# Patient Record
Sex: Female | Born: 1980 | Race: White | Marital: Married | Smoking: Former smoker
Health system: Southern US, Community
[De-identification: ages and names within clinical notes are randomized; demographics above are authoritative.]

## PROBLEM LIST (undated history)

## (undated) ENCOUNTER — Inpatient Hospital Stay (HOSPITAL_COMMUNITY): Payer: Self-pay

## (undated) DIAGNOSIS — E669 Obesity, unspecified: Secondary | ICD-10-CM

## (undated) DIAGNOSIS — E282 Polycystic ovarian syndrome: Secondary | ICD-10-CM

## (undated) DIAGNOSIS — T7840XA Allergy, unspecified, initial encounter: Secondary | ICD-10-CM

## (undated) DIAGNOSIS — I10 Essential (primary) hypertension: Secondary | ICD-10-CM

## (undated) DIAGNOSIS — Z8619 Personal history of other infectious and parasitic diseases: Secondary | ICD-10-CM

## (undated) HISTORY — DX: Allergy, unspecified, initial encounter: T78.40XA

## (undated) HISTORY — DX: Obesity, unspecified: E66.9

## (undated) HISTORY — PX: LAPAROSCOPIC GASTRIC SLEEVE RESECTION: SHX5895

## (undated) HISTORY — PX: CHOLECYSTECTOMY: SHX55

## (undated) HISTORY — PX: OTHER SURGICAL HISTORY: SHX169

## (undated) HISTORY — DX: Personal history of other infectious and parasitic diseases: Z86.19

## (undated) HISTORY — DX: Polycystic ovarian syndrome: E28.2

---

## 2011-06-16 ENCOUNTER — Other Ambulatory Visit: Payer: Self-pay | Admitting: Internal Medicine

## 2011-06-16 DIAGNOSIS — N926 Irregular menstruation, unspecified: Secondary | ICD-10-CM

## 2011-06-17 ENCOUNTER — Ambulatory Visit
Admission: RE | Admit: 2011-06-17 | Discharge: 2011-06-17 | Disposition: A | Discharge status: Home / Self Care | Payer: Self-pay | Source: Ambulatory Visit | Attending: Internal Medicine | Admitting: Internal Medicine

## 2011-06-17 DIAGNOSIS — N926 Irregular menstruation, unspecified: Secondary | ICD-10-CM

## 2011-10-27 ENCOUNTER — Ambulatory Visit: Payer: Self-pay | Admitting: Family

## 2011-11-12 ENCOUNTER — Encounter: Payer: Self-pay | Admitting: Family

## 2011-11-12 ENCOUNTER — Ambulatory Visit (INDEPENDENT_AMBULATORY_CARE_PROVIDER_SITE_OTHER): Payer: Self-pay | Admitting: Family

## 2011-11-12 VITALS — BP 126/86 | HR 82 | Temp 98.1°F | Resp 16 | Ht 62.5 in | Wt 277.0 lb

## 2011-11-12 DIAGNOSIS — N926 Irregular menstruation, unspecified: Secondary | ICD-10-CM | POA: Insufficient documentation

## 2011-11-12 DIAGNOSIS — T7840XA Allergy, unspecified, initial encounter: Secondary | ICD-10-CM

## 2011-11-12 DIAGNOSIS — E669 Obesity, unspecified: Secondary | ICD-10-CM

## 2011-11-12 DIAGNOSIS — J309 Allergic rhinitis, unspecified: Secondary | ICD-10-CM

## 2011-11-12 DIAGNOSIS — J302 Other seasonal allergic rhinitis: Secondary | ICD-10-CM

## 2011-11-12 HISTORY — DX: Allergy, unspecified, initial encounter: T78.40XA

## 2011-11-12 HISTORY — DX: Obesity, unspecified: E66.9

## 2011-11-12 MED ORDER — NYSTATIN 100000 UNIT/GM EX POWD: CUTANEOUS | Status: DC

## 2011-11-12 NOTE — Patient Instructions (Addendum)
Try to keep calories 1200-1500 calories a day. Exercise 30 minutes 5 days a week. Follow up in 3 months.  Welcome to [facility]!

## 2011-11-12 NOTE — Assessment & Plan Note (Signed)
Previous symptom of cough/congestion are consistent with allergies.  Stable now.  Recommended claritin prn.

## 2011-11-12 NOTE — Assessment & Plan Note (Signed)
?  PCOS- she has had work up.  Will request records as well as recent laboratories.

## 2011-11-12 NOTE — Progress Notes (Signed)
Subjective:    Patient ID: Megan Morton, female    DOB: 05-Oct-1980, 31 y.o.   MRN: [id number]  HPI  Ms.  Avellino is a 31 yr old female here today to establish care.   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  She is from [place].  She has gained 90 pounds since she moved to [place] 5 yrs ago.  She reports that she had lab work 3 months ago.    ?gluten allergy- she notes that bread causes a lot of gas.  She has some eczema.    Irregular Menses- Reports that one of her ovaries had "cyst". Reports normal period 2 weeks ago.  Prior to that she had a period 1 year prior.    Cough- Few months ago had "mucous in the morning.  She sometimes gags with coughing.  She grew up with a smoker.      Review of Systems  Constitutional: Positive for unexpected weight change.  HENT: Negative for hearing loss.   Eyes: Negative for visual disturbance.  Respiratory: Positive for cough.   Cardiovascular: Negative for palpitations.  Gastrointestinal: Negative for nausea and vomiting.  Genitourinary: Positive for menstrual problem.  Musculoskeletal:       Some right shoulder pain. Stands a lot.  Knee pain.  bilaterally  Skin:       Eczema arms She reports rash beneath breast.  Neurological: Negative for headaches.  Psychiatric/Behavioral:       Reports good mood.   Happy since she got married.    Past Medical History  Diagnosis Date  . History of chicken pox     History   Social History  . Marital Status: Married    Spouse Name: N/A    Number of Children: N/A  . Years of Education: N/A   Occupational History  . Not on file.   Social History Main Topics  . Smoking status: Not on file  . Smokeless tobacco: Not on file  . Alcohol Use:   . Drug Use:   . Sexually Active:    Other Topics Concern  . Not on file   Social History Narrative  . No narrative on file    History reviewed. No pertinent past surgical history.  Family History  Problem Relation Age of Onset  .  Hypertension Mother   . Stroke Father   . Hypertension Father     Not on File  No current outpatient prescriptions on file prior to visit.    BP 126/86  Pulse 82  Temp 98.1 F (36.7 C) (Oral)  Resp 16  Ht 5' 2.5" (1.588 m)  Wt 277 lb 0.6 oz (125.665 kg)  BMI 49.86 kg/m2  SpO2 98%  LMP 10/29/2011        Objective:   Physical Exam  Constitutional: She is oriented to person, place, and time. She appears well-developed and well-nourished. No distress.  HENT:  Head: Normocephalic and atraumatic.  Neck: No thyromegaly present.  Cardiovascular: Normal rate and regular rhythm.   No murmur heard. Pulmonary/Chest: Effort normal and breath sounds normal.  Abdominal: Soft. Bowel sounds are normal.  Lymphadenopathy:    She has no cervical adenopathy.  Neurological: She is alert and oriented to person, place, and time.  Skin: Skin is warm and dry.  Psychiatric: She has a normal mood and affect. Her behavior is normal. Judgment and thought content normal.          Assessment & Plan:  ?gluten  allergy- should does not wish to pursue costly testing at this time. Advised trial of gluten free diet.

## 2011-11-12 NOTE — Assessment & Plan Note (Signed)
We discussed 1200 calorie diet and exercise today.

## 2011-11-23 ENCOUNTER — Encounter: Payer: Self-pay | Admitting: Family

## 2011-11-23 DIAGNOSIS — E282 Polycystic ovarian syndrome: Secondary | ICD-10-CM

## 2011-11-23 HISTORY — DX: Polycystic ovarian syndrome: E28.2

## 2011-12-27 ENCOUNTER — Telehealth: Payer: Self-pay | Admitting: Family

## 2011-12-27 NOTE — Telephone Encounter (Signed)
Received medical records from [facility]

## 2012-01-31 ENCOUNTER — Encounter: Payer: Self-pay | Admitting: Family

## 2012-01-31 ENCOUNTER — Ambulatory Visit (INDEPENDENT_AMBULATORY_CARE_PROVIDER_SITE_OTHER): Payer: Self-pay | Admitting: Family

## 2012-01-31 VITALS — BP 110/82 | HR 81 | Temp 98.5°F | Resp 16 | Ht 62.5 in | Wt 280.0 lb

## 2012-01-31 DIAGNOSIS — K625 Hemorrhage of anus and rectum: Secondary | ICD-10-CM

## 2012-01-31 DIAGNOSIS — E282 Polycystic ovarian syndrome: Secondary | ICD-10-CM

## 2012-01-31 DIAGNOSIS — E669 Obesity, unspecified: Secondary | ICD-10-CM

## 2012-01-31 DIAGNOSIS — N926 Irregular menstruation, unspecified: Secondary | ICD-10-CM

## 2012-01-31 DIAGNOSIS — K649 Unspecified hemorrhoids: Secondary | ICD-10-CM

## 2012-01-31 LAB — BASIC METABOLIC PANEL
BUN: 12 mg/dL (ref 6–23)
CO2: 26 mEq/L (ref 19–32)
Chloride: 100 mEq/L (ref 96–112)
Creat: 0.62 mg/dL (ref 0.50–1.10)
Glucose, Bld: 89 mg/dL (ref 70–99)
Potassium: 4.3 mEq/L (ref 3.5–5.3)

## 2012-01-31 LAB — CBC
MCV: 79 fL (ref 78.0–100.0)
Platelets: 313 10*3/uL (ref 150–400)
RDW: 13.8 % (ref 11.5–15.5)
WBC: 8.8 10*3/uL (ref 4.0–10.5)

## 2012-01-31 MED ORDER — METFORMIN HCL 500 MG PO TABS: 500.0000 mg | ORAL_TABLET | Freq: Two times a day (BID) | ORAL | Status: DC

## 2012-01-31 NOTE — Patient Instructions (Addendum)
Go to the ER if you develop increased rectal bleeding.  Complete your blood work prior to leaving.  You may use preparation H daily for the next week.  Keep your stools soft.  You can try miralax one cap full daily with goal of one soft BM a day.  If diarrhea, cut back to every other day.   Follow up in 6 weeks, call if your bleeding does not improve.

## 2012-01-31 NOTE — Progress Notes (Signed)
  Subjective:    Patient ID: Megan Morton, female    DOB: 1980/08/28, 31 y.o.   MRN: [id number]  HPI  [name] is a 31 yr old female who presents today for follow up.  Irregular menses- Had normal period 8/8-8/22. She reports that she was given metformin by GYN for PCOS which she has started.  Obesity-  Reports 10 pound weight gain, but down again.  She has been working out and dieting x 2 weeks.  Sister brought her butram from [place]. She is wondering if this is safe to take for weight loss. Also inquires about Garcinia canbogia   Rectal bleeding- x 10 days.  Denies abdominal pain.  Blood is on the tissue.  Hx of hemorrhoids.  Review of Systems    see HPI  Past Medical History  Diagnosis Date  . History of chicken pox     History   Social History  . Marital Status: Married    Spouse Name: N/A    Number of Children: N/A  . Years of Education: N/A   Occupational History  . Not on file.   Social History Main Topics  . Smoking status: Current Some Day Smoker  . Smokeless tobacco: Not on file  . Alcohol Use: Not on file  . Drug Use: Not on file  . Sexually Active: Not on file   Other Topics Concern  . Not on file   Social History Narrative  . No narrative on file    No past surgical history on file.  Family History  Problem Relation Age of Onset  . Hypertension Mother   . Stroke Father   . Hypertension Father     Not on File  Current Outpatient Prescriptions on File Prior to Visit  Medication Sig Dispense Refill  . nystatin (MYCOSTATIN) powder Apply twice daily beneath breasts as needed for rash.  30 g  1  . metFORMIN (GLUCOPHAGE) 500 MG tablet Take 1 tablet (500 mg total) by mouth 2 (two) times daily with a meal.  60 tablet  0    BP 110/82  Pulse 81  Temp 98.5 F (36.9 C) (Oral)  Resp 16  Ht 5' 2.5" (1.588 m)  Wt 280 lb (127.007 kg)  BMI 50.40 kg/m2  SpO2 99%  LMP 01/13/2012    Objective:   Physical Exam  Constitutional: She is oriented to  person, place, and time. She appears well-developed and well-nourished.  Cardiovascular: Normal rate and regular rhythm.   No murmur heard. Genitourinary:       Some small external hemorrhoids.  Heme neg, normal rectal exam.  Musculoskeletal: She exhibits no edema.  Neurological: She is alert and oriented to person, place, and time.  Skin: Skin is warm and dry. No rash noted. No erythema. No pallor.  Psychiatric: She has a normal mood and affect. Her behavior is normal. Judgment and thought content normal.          Assessment & Plan:

## 2012-02-01 ENCOUNTER — Encounter: Payer: Self-pay | Admitting: Family

## 2012-02-01 NOTE — Assessment & Plan Note (Signed)
I advised her against Butrans which was taken off market in [place] due to safety concerns.  I also advised against Garcinia canbogia as drug interactions unknown as this is not FDA approved.  We discussed continuing her diet efforts and exercise.

## 2012-02-02 DIAGNOSIS — K649 Unspecified hemorrhoids: Secondary | ICD-10-CM | POA: Insufficient documentation

## 2012-02-02 NOTE — Assessment & Plan Note (Addendum)
Recommended that she use miralax to help soften stools.  Also add preparation H and obtain CBC to assess for anemia.  Pt is to call if bleeding worsens or if it does not improve.

## 2012-02-02 NOTE — Assessment & Plan Note (Signed)
Improved with the addition of metfromin.

## 2012-02-02 NOTE — Assessment & Plan Note (Signed)
Continue metformin.

## 2012-03-08 ENCOUNTER — Ambulatory Visit: Payer: Self-pay | Admitting: Internal Medicine

## 2012-05-24 HISTORY — PX: WISDOM TOOTH EXTRACTION: SHX21

## 2012-05-24 LAB — HM PAP SMEAR: HM PAP: NORMAL

## 2013-12-17 ENCOUNTER — Encounter: Payer: Self-pay | Admitting: Family Medicine

## 2013-12-17 ENCOUNTER — Ambulatory Visit (INDEPENDENT_AMBULATORY_CARE_PROVIDER_SITE_OTHER): Payer: BC Managed Care – PPO | Admitting: Family Medicine

## 2013-12-17 VITALS — BP 122/84 | HR 83 | Temp 98.7°F | Ht 62.5 in | Wt 281.0 lb

## 2013-12-17 DIAGNOSIS — M5441 Lumbago with sciatica, right side: Secondary | ICD-10-CM

## 2013-12-17 DIAGNOSIS — M5430 Sciatica, unspecified side: Secondary | ICD-10-CM

## 2013-12-17 DIAGNOSIS — E669 Obesity, unspecified: Secondary | ICD-10-CM

## 2013-12-17 DIAGNOSIS — K649 Unspecified hemorrhoids: Secondary | ICD-10-CM

## 2013-12-17 NOTE — Patient Instructions (Signed)
Preventive Care for Adults A healthy lifestyle and preventive care can promote health and wellness. Preventive health guidelines for women include the following key practices.  A routine yearly physical is a good way to check with your health care provider about your health and preventive screening. It is a chance to share any concerns and updates on your health and to receive a thorough exam.  Visit your dentist for a routine exam and preventive care every 6 months. Brush your teeth twice a day and floss once a day. Good oral hygiene prevents tooth decay and gum disease.  The frequency of eye exams is based on your age, health, family medical history, use of contact lenses, and other factors. Follow your health care provider's recommendations for frequency of eye exams.  Eat a healthy diet. Foods like vegetables, fruits, whole grains, low-fat dairy products, and lean protein foods contain the nutrients you need without too many calories. Decrease your intake of foods high in solid fats, added sugars, and salt. Eat the right amount of calories for you.Get information about a proper diet from your health care provider, if necessary.  Regular physical exercise is one of the most important things you can do for your health. Most adults should get at least 150 minutes of moderate-intensity exercise (any activity that increases your heart rate and causes you to sweat) each week. In addition, most adults need muscle-strengthening exercises on 2 or more days a week.  Maintain a healthy weight. The body mass index (BMI) is a screening tool to identify possible weight problems. It provides an estimate of body fat based on height and weight. Your health care provider can find your BMI and can help you achieve or maintain a healthy weight.For adults 20 years and older:  A BMI below 18.5 is considered underweight.  A BMI of 18.5 to 24.9 is normal.  A BMI of 25 to 29.9 is considered overweight.  A BMI of  30 and above is considered obese.  Maintain normal blood lipids and cholesterol levels by exercising and minimizing your intake of saturated fat. Eat a balanced diet with plenty of fruit and vegetables. Blood tests for lipids and cholesterol should begin at age 76 and be repeated every 5 years. If your lipid or cholesterol levels are high, you are over 50, or you are at high risk for heart disease, you may need your cholesterol levels checked more frequently.Ongoing high lipid and cholesterol levels should be treated with medicines if diet and exercise are not working.  If you smoke, find out from your health care provider how to quit. If you do not use tobacco, do not start.  Lung cancer screening is recommended for adults aged 22-80 years who are at high risk for developing lung cancer because of a history of smoking. A yearly low-dose CT scan of the lungs is recommended for people who have at least a 30-pack-year history of smoking and are a current smoker or have quit within the past 15 years. A pack year of smoking is smoking an average of 1 pack of cigarettes a day for 1 year (for example: 1 pack a day for 30 years or 2 packs a day for 15 years). Yearly screening should continue until the smoker has stopped smoking for at least 15 years. Yearly screening should be stopped for people who develop a health problem that would prevent them from having lung cancer treatment.  If you are pregnant, do not drink alcohol. If you are breastfeeding,  be very cautious about drinking alcohol. If you are not pregnant and choose to drink alcohol, do not have more than 1 drink per day. One drink is considered to be 12 ounces (355 mL) of beer, 5 ounces (148 mL) of wine, or 1.5 ounces (44 mL) of liquor.  Avoid use of street drugs. Do not share needles with anyone. Ask for help if you need support or instructions about stopping the use of drugs.  High blood pressure causes heart disease and increases the risk of  stroke. Your blood pressure should be checked at least every 1 to 2 years. Ongoing high blood pressure should be treated with medicines if weight loss and exercise do not work.  If you are 75-52 years old, ask your health care provider if you should take aspirin to prevent strokes.  Diabetes screening involves taking a blood sample to check your fasting blood sugar level. This should be done once every 3 years, after age 15, if you are within normal weight and without risk factors for diabetes. Testing should be considered at a younger age or be carried out more frequently if you are overweight and have at least 1 risk factor for diabetes.  Breast cancer screening is essential preventive care for women. You should practice "breast self-awareness." This means understanding the normal appearance and feel of your breasts and may include breast self-examination. Any changes detected, no matter how small, should be reported to a health care provider. Women in their 58s and 30s should have a clinical breast exam (CBE) by a health care provider as part of a regular health exam every 1 to 3 years. After age 16, women should have a CBE every year. Starting at age 53, women should consider having a mammogram (breast X-ray test) every year. Women who have a family history of breast cancer should talk to their health care provider about genetic screening. Women at a high risk of breast cancer should talk to their health care providers about having an MRI and a mammogram every year.  Breast cancer gene (BRCA)-related cancer risk assessment is recommended for women who have family members with BRCA-related cancers. BRCA-related cancers include breast, ovarian, tubal, and peritoneal cancers. Having family members with these cancers may be associated with an increased risk for harmful changes (mutations) in the breast cancer genes BRCA1 and BRCA2. Results of the assessment will determine the need for genetic counseling and  BRCA1 and BRCA2 testing.  Routine pelvic exams to screen for cancer are no longer recommended for nonpregnant women who are considered low risk for cancer of the pelvic organs (ovaries, uterus, and vagina) and who do not have symptoms. Ask your health care provider if a screening pelvic exam is right for you.  If you have had past treatment for cervical cancer or a condition that could lead to cancer, you need Pap tests and screening for cancer for at least 20 years after your treatment. If Pap tests have been discontinued, your risk factors (such as having a new sexual partner) need to be reassessed to determine if screening should be resumed. Some women have medical problems that increase the chance of getting cervical cancer. In these cases, your health care provider may recommend more frequent screening and Pap tests.  The HPV test is an additional test that may be used for cervical cancer screening. The HPV test looks for the virus that can cause the cell changes on the cervix. The cells collected during the Pap test can be  tested for HPV. The HPV test could be used to screen women aged 30 years and older, and should be used in women of any age who have unclear Pap test results. After the age of 30, women should have HPV testing at the same frequency as a Pap test.  Colorectal cancer can be detected and often prevented. Most routine colorectal cancer screening begins at the age of 50 years and continues through age 75 years. However, your health care provider may recommend screening at an earlier age if you have risk factors for colon cancer. On a yearly basis, your health care provider may provide home test kits to check for hidden blood in the stool. Use of a small camera at the end of a tube, to directly examine the colon (sigmoidoscopy or colonoscopy), can detect the earliest forms of colorectal cancer. Talk to your health care provider about this at age 50, when routine screening begins. Direct  exam of the colon should be repeated every 5-10 years through age 75 years, unless early forms of pre-cancerous polyps or small growths are found.  People who are at an increased risk for hepatitis B should be screened for this virus. You are considered at high risk for hepatitis B if:  You were born in a country where hepatitis B occurs often. Talk with your health care provider about which countries are considered high risk.  Your parents were born in a high-risk country and you have not received a shot to protect against hepatitis B (hepatitis B vaccine).  You have HIV or AIDS.  You use needles to inject street drugs.  You live with, or have sex with, someone who has hepatitis B.  You get hemodialysis treatment.  You take certain medicines for conditions like cancer, organ transplantation, and autoimmune conditions.  Hepatitis C blood testing is recommended for all people born from 1945 through 1965 and any individual with known risks for hepatitis C.  Practice safe sex. Use condoms and avoid high-risk sexual practices to reduce the spread of sexually transmitted infections (STIs). STIs include gonorrhea, chlamydia, syphilis, trichomonas, herpes, HPV, and human immunodeficiency virus (HIV). Herpes, HIV, and HPV are viral illnesses that have no cure. They can result in disability, cancer, and death.  You should be screened for sexually transmitted illnesses (STIs) including gonorrhea and chlamydia if:  You are sexually active and are younger than 24 years.  You are older than 24 years and your health care provider tells you that you are at risk for this type of infection.  Your sexual activity has changed since you were last screened and you are at an increased risk for chlamydia or gonorrhea. Ask your health care provider if you are at risk.  If you are at risk of being infected with HIV, it is recommended that you take a prescription medicine daily to prevent HIV infection. This is  called preexposure prophylaxis (PrEP). You are considered at risk if:  You are a heterosexual woman, are sexually active, and are at increased risk for HIV infection.  You take drugs by injection.  You are sexually active with a partner who has HIV.  Talk with your health care provider about whether you are at high risk of being infected with HIV. If you choose to begin PrEP, you should first be tested for HIV. You should then be tested every 3 months for as long as you are taking PrEP.  Osteoporosis is a disease in which the bones lose minerals and strength   with aging. This can result in serious bone fractures or breaks. The risk of osteoporosis can be identified using a bone density scan. Women ages 65 years and over and women at risk for fractures or osteoporosis should discuss screening with their health care providers. Ask your health care provider whether you should take a calcium supplement or vitamin D to reduce the rate of osteoporosis.  Menopause can be associated with physical symptoms and risks. Hormone replacement therapy is available to decrease symptoms and risks. You should talk to your health care provider about whether hormone replacement therapy is right for you.  Use sunscreen. Apply sunscreen liberally and repeatedly throughout the day. You should seek shade when your shadow is shorter than you. Protect yourself by wearing long sleeves, pants, a wide-brimmed hat, and sunglasses year round, whenever you are outdoors.  Once a month, do a whole body skin exam, using a mirror to look at the skin on your back. Tell your health care provider of new moles, moles that have irregular borders, moles that are larger than a pencil eraser, or moles that have changed in shape or color.  Stay current with required vaccines (immunizations).  Influenza vaccine. All adults should be immunized every year.  Tetanus, diphtheria, and acellular pertussis (Td, Tdap) vaccine. Pregnant women should  receive 1 dose of Tdap vaccine during each pregnancy. The dose should be obtained regardless of the length of time since the last dose. Immunization is preferred during the 27th-36th week of gestation. An adult who has not previously received Tdap or who does not know her vaccine status should receive 1 dose of Tdap. This initial dose should be followed by tetanus and diphtheria toxoids (Td) booster doses every 10 years. Adults with an unknown or incomplete history of completing a 3-dose immunization series with Td-containing vaccines should begin or complete a primary immunization series including a Tdap dose. Adults should receive a Td booster every 10 years.  Varicella vaccine. An adult without evidence of immunity to varicella should receive 2 doses or a second dose if she has previously received 1 dose. Pregnant females who do not have evidence of immunity should receive the first dose after pregnancy. This first dose should be obtained before leaving the health care facility. The second dose should be obtained 4-8 weeks after the first dose.  Human papillomavirus (HPV) vaccine. Females aged 13-26 years who have not received the vaccine previously should obtain the 3-dose series. The vaccine is not recommended for use in pregnant females. However, pregnancy testing is not needed before receiving a dose. If a female is found to be pregnant after receiving a dose, no treatment is needed. In that case, the remaining doses should be delayed until after the pregnancy. Immunization is recommended for any person with an immunocompromised condition through the age of 26 years if she did not get any or all doses earlier. During the 3-dose series, the second dose should be obtained 4-8 weeks after the first dose. The third dose should be obtained 24 weeks after the first dose and 16 weeks after the second dose.  Zoster vaccine. One dose is recommended for adults aged 60 years or older unless certain conditions are  present.  Measles, mumps, and rubella (MMR) vaccine. Adults born before 1957 generally are considered immune to measles and mumps. Adults born in 1957 or later should have 1 or more doses of MMR vaccine unless there is a contraindication to the vaccine or there is laboratory evidence of immunity to   each of the three diseases. A routine second dose of MMR vaccine should be obtained at least 28 days after the first dose for students attending postsecondary schools, health care workers, or international travelers. People who received inactivated measles vaccine or an unknown type of measles vaccine during 1963-1967 should receive 2 doses of MMR vaccine. People who received inactivated mumps vaccine or an unknown type of mumps vaccine before 1979 and are at high risk for mumps infection should consider immunization with 2 doses of MMR vaccine. For females of childbearing age, rubella immunity should be determined. If there is no evidence of immunity, females who are not pregnant should be vaccinated. If there is no evidence of immunity, females who are pregnant should delay immunization until after pregnancy. Unvaccinated health care workers born before 1957 who lack laboratory evidence of measles, mumps, or rubella immunity or laboratory confirmation of disease should consider measles and mumps immunization with 2 doses of MMR vaccine or rubella immunization with 1 dose of MMR vaccine.  Pneumococcal 13-valent conjugate (PCV13) vaccine. When indicated, a person who is uncertain of her immunization history and has no record of immunization should receive the PCV13 vaccine. An adult aged 19 years or older who has certain medical conditions and has not been previously immunized should receive 1 dose of PCV13 vaccine. This PCV13 should be followed with a dose of pneumococcal polysaccharide (PPSV23) vaccine. The PPSV23 vaccine dose should be obtained at least 8 weeks after the dose of PCV13 vaccine. An adult aged 19  years or older who has certain medical conditions and previously received 1 or more doses of PPSV23 vaccine should receive 1 dose of PCV13. The PCV13 vaccine dose should be obtained 1 or more years after the last PPSV23 vaccine dose.  Pneumococcal polysaccharide (PPSV23) vaccine. When PCV13 is also indicated, PCV13 should be obtained first. All adults aged 65 years and older should be immunized. An adult younger than age 65 years who has certain medical conditions should be immunized. Any person who resides in a nursing home or long-term care facility should be immunized. An adult smoker should be immunized. People with an immunocompromised condition and certain other conditions should receive both PCV13 and PPSV23 vaccines. People with human immunodeficiency virus (HIV) infection should be immunized as soon as possible after diagnosis. Immunization during chemotherapy or radiation therapy should be avoided. Routine use of PPSV23 vaccine is not recommended for American Indians, [place] Natives, or people younger than 65 years unless there are medical conditions that require PPSV23 vaccine. When indicated, people who have unknown immunization and have no record of immunization should receive PPSV23 vaccine. One-time revaccination 5 years after the first dose of PPSV23 is recommended for people aged 19-64 years who have chronic kidney failure, nephrotic syndrome, asplenia, or immunocompromised conditions. People who received 1-2 doses of PPSV23 before age 65 years should receive another dose of PPSV23 vaccine at age 65 years or later if at least 5 years have passed since the previous dose. Doses of PPSV23 are not needed for people immunized with PPSV23 at or after age 65 years.  Meningococcal vaccine. Adults with asplenia or persistent complement component deficiencies should receive 2 doses of quadrivalent meningococcal conjugate (MenACWY-D) vaccine. The doses should be obtained at least 2 months apart.  Microbiologists working with certain meningococcal bacteria, military recruits, people at risk during an outbreak, and people who travel to or live in countries with a high rate of meningitis should be immunized. A first-year college student up through age   21 years who is living in a residence hall should receive a dose if she did not receive a dose on or after her 16th birthday. Adults who have certain high-risk conditions should receive one or more doses of vaccine.  Hepatitis A vaccine. Adults who wish to be protected from this disease, have certain high-risk conditions, work with hepatitis A-infected animals, work in hepatitis A research labs, or travel to or work in countries with a high rate of hepatitis A should be immunized. Adults who were previously unvaccinated and who anticipate close contact with an international adoptee during the first 60 days after arrival in the [place] from a country with a high rate of hepatitis A should be immunized.  Hepatitis B vaccine. Adults who wish to be protected from this disease, have certain high-risk conditions, may be exposed to blood or other infectious body fluids, are household contacts or sex partners of hepatitis B positive people, are clients or workers in certain care facilities, or travel to or work in countries with a high rate of hepatitis B should be immunized.  Haemophilus influenzae type b (Hib) vaccine. A previously unvaccinated person with asplenia or sickle cell disease or having a scheduled splenectomy should receive 1 dose of Hib vaccine. Regardless of previous immunization, a recipient of a hematopoietic stem cell transplant should receive a 3-dose series 6-12 months after her successful transplant. Hib vaccine is not recommended for adults with HIV infection. Preventive Services / Frequency Ages 64 to 68 years  Blood pressure check.** / Every 1 to 2 years.  Lipid and cholesterol check.** / Every 5 years beginning at age  22.  Clinical breast exam.** / Every 3 years for women in their 88s and 53s.  BRCA-related cancer risk assessment.** / For women who have family members with a BRCA-related cancer (breast, ovarian, tubal, or peritoneal cancers).  Pap test.** / Every 2 years from ages 90 through 51. Every 3 years starting at age 21 through age 56 or 3 with a history of 3 consecutive normal Pap tests.  HPV screening.** / Every 3 years from ages 24 through ages 1 to 46 with a history of 3 consecutive normal Pap tests.  Hepatitis C blood test.** / For any individual with known risks for hepatitis C.  Skin self-exam. / Monthly.  Influenza vaccine. / Every year.  Tetanus, diphtheria, and acellular pertussis (Tdap, Td) vaccine.** / Consult your health care provider. Pregnant women should receive 1 dose of Tdap vaccine during each pregnancy. 1 dose of Td every 10 years.  Varicella vaccine.** / Consult your health care provider. Pregnant females who do not have evidence of immunity should receive the first dose after pregnancy.  HPV vaccine. / 3 doses over 6 months, if 72 and younger. The vaccine is not recommended for use in pregnant females. However, pregnancy testing is not needed before receiving a dose.  Measles, mumps, rubella (MMR) vaccine.** / You need at least 1 dose of MMR if you were born in 1957 or later. You may also need a 2nd dose. For females of childbearing age, rubella immunity should be determined. If there is no evidence of immunity, females who are not pregnant should be vaccinated. If there is no evidence of immunity, females who are pregnant should delay immunization until after pregnancy.  Pneumococcal 13-valent conjugate (PCV13) vaccine.** / Consult your health care provider.  Pneumococcal polysaccharide (PPSV23) vaccine.** / 1 to 2 doses if you smoke cigarettes or if you have certain conditions.  Meningococcal vaccine.** /  1 dose if you are age 19 to 21 years and a first-year college  student living in a residence hall, or have one of several medical conditions, you need to get vaccinated against meningococcal disease. You may also need additional booster doses.  Hepatitis A vaccine.** / Consult your health care provider.  Hepatitis B vaccine.** / Consult your health care provider.  Haemophilus influenzae type b (Hib) vaccine.** / Consult your health care provider. Ages 40 to 64 years  Blood pressure check.** / Every 1 to 2 years.  Lipid and cholesterol check.** / Every 5 years beginning at age 20 years.  Lung cancer screening. / Every year if you are aged 55-80 years and have a 30-pack-year history of smoking and currently smoke or have quit within the past 15 years. Yearly screening is stopped once you have quit smoking for at least 15 years or develop a health problem that would prevent you from having lung cancer treatment.  Clinical breast exam.** / Every year after age 40 years.  BRCA-related cancer risk assessment.** / For women who have family members with a BRCA-related cancer (breast, ovarian, tubal, or peritoneal cancers).  Mammogram.** / Every year beginning at age 40 years and continuing for as long as you are in good health. Consult with your health care provider.  Pap test.** / Every 3 years starting at age 30 years through age 65 or 70 years with a history of 3 consecutive normal Pap tests.  HPV screening.** / Every 3 years from ages 30 years through ages 65 to 70 years with a history of 3 consecutive normal Pap tests.  Fecal occult blood test (FOBT) of stool. / Every year beginning at age 50 years and continuing until age 75 years. You may not need to do this test if you get a colonoscopy every 10 years.  Flexible sigmoidoscopy or colonoscopy.** / Every 5 years for a flexible sigmoidoscopy or every 10 years for a colonoscopy beginning at age 50 years and continuing until age 75 years.  Hepatitis C blood test.** / For all people born from 1945 through  1965 and any individual with known risks for hepatitis C.  Skin self-exam. / Monthly.  Influenza vaccine. / Every year.  Tetanus, diphtheria, and acellular pertussis (Tdap/Td) vaccine.** / Consult your health care provider. Pregnant women should receive 1 dose of Tdap vaccine during each pregnancy. 1 dose of Td every 10 years.  Varicella vaccine.** / Consult your health care provider. Pregnant females who do not have evidence of immunity should receive the first dose after pregnancy.  Zoster vaccine.** / 1 dose for adults aged 60 years or older.  Measles, mumps, rubella (MMR) vaccine.** / You need at least 1 dose of MMR if you were born in 1957 or later. You may also need a 2nd dose. For females of childbearing age, rubella immunity should be determined. If there is no evidence of immunity, females who are not pregnant should be vaccinated. If there is no evidence of immunity, females who are pregnant should delay immunization until after pregnancy.  Pneumococcal 13-valent conjugate (PCV13) vaccine.** / Consult your health care provider.  Pneumococcal polysaccharide (PPSV23) vaccine.** / 1 to 2 doses if you smoke cigarettes or if you have certain conditions.  Meningococcal vaccine.** / Consult your health care provider.  Hepatitis A vaccine.** / Consult your health care provider.  Hepatitis B vaccine.** / Consult your health care provider.  Haemophilus influenzae type b (Hib) vaccine.** / Consult your health care provider. Ages 65   years and over  Blood pressure check.** / Every 1 to 2 years.  Lipid and cholesterol check.** / Every 5 years beginning at age 22 years.  Lung cancer screening. / Every year if you are aged 73-80 years and have a 30-pack-year history of smoking and currently smoke or have quit within the past 15 years. Yearly screening is stopped once you have quit smoking for at least 15 years or develop a health problem that would prevent you from having lung cancer  treatment.  Clinical breast exam.** / Every year after age 4 years.  BRCA-related cancer risk assessment.** / For women who have family members with a BRCA-related cancer (breast, ovarian, tubal, or peritoneal cancers).  Mammogram.** / Every year beginning at age 40 years and continuing for as long as you are in good health. Consult with your health care provider.  Pap test.** / Every 3 years starting at age 9 years through age 34 or 91 years with 3 consecutive normal Pap tests. Testing can be stopped between 65 and 70 years with 3 consecutive normal Pap tests and no abnormal Pap or HPV tests in the past 10 years.  HPV screening.** / Every 3 years from ages 57 years through ages 64 or 45 years with a history of 3 consecutive normal Pap tests. Testing can be stopped between 65 and 70 years with 3 consecutive normal Pap tests and no abnormal Pap or HPV tests in the past 10 years.  Fecal occult blood test (FOBT) of stool. / Every year beginning at age 15 years and continuing until age 17 years. You may not need to do this test if you get a colonoscopy every 10 years.  Flexible sigmoidoscopy or colonoscopy.** / Every 5 years for a flexible sigmoidoscopy or every 10 years for a colonoscopy beginning at age 86 years and continuing until age 71 years.  Hepatitis C blood test.** / For all people born from [birth year] through 1963 and any individual with known risks for hepatitis C.  Osteoporosis screening.** / A one-time screening for women ages 83 years and over and women at risk for fractures or osteoporosis.  Skin self-exam. / Monthly.  Influenza vaccine. / Every year.  Tetanus, diphtheria, and acellular pertussis (Tdap/Td) vaccine.** / 1 dose of Td every 10 years.  Varicella vaccine.** / Consult your health care provider.  Zoster vaccine.** / 1 dose for adults aged 61 years or older.  Pneumococcal 13-valent conjugate (PCV13) vaccine.** / Consult your health care provider.  Pneumococcal  polysaccharide (PPSV23) vaccine.** / 1 dose for all adults aged 28 years and older.  Meningococcal vaccine.** / Consult your health care provider.  Hepatitis A vaccine.** / Consult your health care provider.  Hepatitis B vaccine.** / Consult your health care provider.  Haemophilus influenzae type b (Hib) vaccine.** / Consult your health care provider. ** Family history and personal history of risk and conditions may change your health care provider's recommendations. Document Released: 07/06/2001 Document Revised: 09/24/2013 Document Reviewed: 10/05/2010 [facility] Patient Information 2015 [place], [place]. This information is not intended to replace advice given to you by your health care provider. Make sure you discuss any questions you have with your health care provider.

## 2013-12-17 NOTE — Progress Notes (Signed)
Pre visit review using our clinic review tool, if applicable. No additional management support is needed unless otherwise documented below in the visit note. 

## 2013-12-23 ENCOUNTER — Encounter: Payer: Self-pay | Admitting: Family Medicine

## 2013-12-23 DIAGNOSIS — M545 Low back pain: Secondary | ICD-10-CM | POA: Insufficient documentation

## 2013-12-23 DIAGNOSIS — M79604 Pain in right leg: Secondary | ICD-10-CM | POA: Insufficient documentation

## 2013-12-23 NOTE — Assessment & Plan Note (Signed)
Encouraged DASH diet, decrease po intake and increase exercise as tolerated. Needs 7-8 hours of sleep nightly. Avoid trans fats, eat small, frequent meals every 4-5 hours with lean proteins, complex carbs and healthy fats. Minimize simple carbs 

## 2013-12-23 NOTE — Assessment & Plan Note (Addendum)
No new or recent episodes. Encouraged increased hydration and fiber in diet. Daily probiotics. If bowels not moving can use MOM 2 tbls po in 4 oz of warm prune juice by mouth every 2-3 days. If no results then repeat in 4 hours with  Dulcolax suppository pr, may repeat again in 4 more hours as needed. Seek care if symptoms worsen.

## 2013-12-23 NOTE — Progress Notes (Signed)
Patient ID: Megan AngelMary Morton, female   DOB: 06/16/1980, 33 y.o.   MRN: [id number] Megan AngelMary Morton [id number] 05/24/1980 12/23/2013      Progress Note-Follow Up  Subjective  Chief Complaint  Chief Complaint  Patient presents with  . Establish Care    new patient    HPI  Patient is a 33 year old female in today to establish care. She is feeling fairly well but is frustrated with her ongoing weight concerns. No recent illness. Has been following with orthopaedics for her low back pain and some radicular symptoms into her rigttleg including some numbness into right foot. Has been advised she has issues with bulging discs but notes and images not available at time of visit. No falls or trauma, no incontinence. Denies CP/palp/SOB/HA/congestion/fevers/GI or GU c/o. Taking meds as prescribed  Past Medical History  Diagnosis Date  . History of chicken pox   . Polycystic ovarian disease 11/23/2011  . 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 Obesity 11/12/2011    Past Surgical History  Procedure Laterality Date  . Wisdom tooth extraction  2014    Family History  Problem Relation Age of Onset  . Hypertension Mother   . Stroke Father   . Hypertension Father     History   Social History  . Marital Status: Married    Spouse Name: N/A    Number of Children: N/A  . Years of Education: N/A   Occupational History  . Not on file.   Social History Main Topics  . Smoking status: Current Some Day Smoker  . Smokeless tobacco: Never Used     Comment: hookah  . Alcohol Use: No  . Drug Use: No  . Sexual Activity: Yes    Partners: Male   Other Topics Concern  . Not on file   Social History Narrative  . No narrative on file    No current outpatient prescriptions on file prior to visit.   No current facility-administered medications on file prior to visit.    No Known Allergies  Review of Systems  Review of Systems  Constitutional: Positive for malaise/fatigue. Negative for fever.  HENT: Negative for  congestion.   Eyes: Negative for discharge.  Respiratory: Negative for shortness of breath.   Cardiovascular: Negative for chest pain, palpitations and leg swelling.  Gastrointestinal: Negative for nausea, abdominal pain and diarrhea.  Genitourinary: Negative for dysuria.  Musculoskeletal: Negative for falls.  Skin: Negative for rash.  Neurological: Negative for loss of consciousness and headaches.  Endo/Heme/Allergies: Negative for polydipsia.  Psychiatric/Behavioral: Negative for depression and suicidal ideas. The patient is not nervous/anxious and does not have insomnia.     Objective  BP 122/84  Pulse 83  Temp(Src) 98.7 F (37.1 C) (Oral)  Ht 5' 2.5" (1.588 m)  Wt 281 lb 0.6 oz (127.479 kg)  BMI 50.55 kg/m2  SpO2 97%  LMP 11/29/2013  Physical Exam  Physical Exam  Constitutional: She is oriented to person, place, and time and well-developed, well-nourished, and in no distress. No distress.  HENT:  Head: Normocephalic and atraumatic.  Right Ear: External ear normal.  Left Ear: External ear normal.  Nose: Nose normal.  Mouth/Throat: Oropharynx is clear and moist. No oropharyngeal exudate.  Eyes: Conjunctivae are normal. Pupils are equal, round, and reactive to light. Right eye exhibits no discharge. Left eye exhibits no discharge. No scleral icterus.  Neck: Normal range of motion. Neck supple. No thyromegaly present.  Cardiovascular: Normal rate, regular rhythm, normal heart sounds and intact distal pulses.  No murmur heard. Pulmonary/Chest: Effort normal and breath sounds normal. No respiratory distress. She has no wheezes. She has no rales.  Abdominal: Soft. Bowel sounds are normal. She exhibits no distension and no mass. There is no tenderness.  Musculoskeletal: Normal range of motion. She exhibits no edema and no tenderness.  Lymphadenopathy:    She has no cervical adenopathy.  Neurological: She is alert and oriented to person, place, and time. She has normal  reflexes. No cranial nerve deficit. Coordination normal.  Skin: Skin is warm and dry. No rash noted. She is not diaphoretic.  Psychiatric: Mood, memory and affect normal.    No results found for this basename: TSH   Lab Results  Component Value Date   WBC 8.8 01/31/2012   HGB 13.3 01/31/2012   HCT 39.8 01/31/2012   MCV 79.0 01/31/2012   PLT 313 01/31/2012   Lab Results  Component Value Date   CREATININE 0.62 01/31/2012   BUN 12 01/31/2012   NA 136 01/31/2012   K 4.3 01/31/2012   CL 100 01/31/2012   CO2 26 01/31/2012     Assessment & Plan  Low back pain radiating to right leg Encouraged activity as tolerated and topical treatments such as Salon Pas prn. Attempt modest weight loss. Referred for PT  Obesity Encouraged DASH diet, decrease po intake and increase exercise as tolerated. Needs 7-8 hours of sleep nightly. Avoid trans fats, eat small, frequent meals every 4-5 hours with lean proteins, complex carbs and healthy fats. Minimize simple carbs  Bleeding hemorrhoids No new or recent episodes. Encouraged increased hydration and fiber in diet. Daily probiotics. If bowels not moving can use MOM 2 tbls po in 4 oz of warm prune juice by mouth every 2-3 days. If no results then repeat in 4 hours with  Dulcolax suppository pr, may repeat again in 4 more hours as needed. Seek care if symptoms worsen.

## 2013-12-23 NOTE — Assessment & Plan Note (Addendum)
Encouraged activity as tolerated and topical treatments such as Salon Pas prn. Attempt modest weight loss. Referred for PT

## 2013-12-28 ENCOUNTER — Ambulatory Visit: Payer: BC Managed Care – PPO | Admitting: Physical Therapy

## 2014-01-03 ENCOUNTER — Ambulatory Visit: Payer: BC Managed Care – PPO | Admitting: Physical Therapy

## 2014-02-06 ENCOUNTER — Encounter: Payer: BC Managed Care – PPO | Admitting: Family

## 2014-02-06 ENCOUNTER — Telehealth: Payer: Self-pay | Admitting: Family

## 2014-02-06 DIAGNOSIS — Z0289 Encounter for other administrative examinations: Secondary | ICD-10-CM

## 2014-02-06 NOTE — Telephone Encounter (Signed)
Please send no show letter

## 2014-02-06 NOTE — Telephone Encounter (Signed)
Pt no showed 7:15 apt on 9/19 for CPE.

## 2014-02-06 NOTE — Telephone Encounter (Signed)
Letter mailed

## 2014-03-21 ENCOUNTER — Encounter: Payer: Self-pay | Admitting: Family Medicine

## 2014-03-21 ENCOUNTER — Ambulatory Visit (INDEPENDENT_AMBULATORY_CARE_PROVIDER_SITE_OTHER): Payer: BC Managed Care – PPO | Admitting: Family Medicine

## 2014-03-21 VITALS — BP 139/76 | HR 98 | Temp 98.5°F | Ht 62.5 in | Wt 288.2 lb

## 2014-03-21 DIAGNOSIS — M545 Low back pain, unspecified: Secondary | ICD-10-CM

## 2014-03-21 DIAGNOSIS — T7840XD Allergy, unspecified, subsequent encounter: Secondary | ICD-10-CM

## 2014-03-21 DIAGNOSIS — E669 Obesity, unspecified: Secondary | ICD-10-CM

## 2014-03-21 DIAGNOSIS — K649 Unspecified hemorrhoids: Secondary | ICD-10-CM

## 2014-03-21 DIAGNOSIS — M79604 Pain in right leg: Secondary | ICD-10-CM

## 2014-03-21 NOTE — Progress Notes (Signed)
Pre visit review using our clinic review tool, if applicable. No additional management support is needed unless otherwise documented below in the visit note. 

## 2014-03-21 NOTE — Patient Instructions (Signed)
Clean bottom with witch hazel astringent each time, if still have itching or irritation then apply Preparation H  Cream is still having trouble let us know.   Salon Pas Gel or patches for pain  Monthly breast Exam  PMD to Dr Abner GreenspanBlyth   [facility] DASH stands for "Dietary Approaches to Stop Hypertension." The DASH eating plan is a healthy eating plan that has been shown to reduce high blood pressure (hypertension). Additional health benefits may include reducing the risk of type 2 diabetes mellitus, heart disease, and stroke. The DASH eating plan may also help with weight loss. WHAT DO I NEED TO KNOW ABOUT THE DASH EATING PLAN? For the DASH eating plan, you will follow these general guidelines:  Choose foods with a percent daily value for sodium of less than 5% (as listed on the food label).  Use salt-free seasonings or herbs instead of table salt or sea salt.  Check with your health care provider or pharmacist before using salt substitutes.  Eat lower-sodium products, often labeled as "lower sodium" or "no salt added."  Eat fresh foods.  Eat more vegetables, fruits, and low-fat dairy products.  Choose whole grains. Look for the word "whole" as the first word in the ingredient list.  Choose fish and skinless chicken or [place] more often than red meat. Limit fish, poultry, and meat to 6 oz (170 g) each day.  Limit sweets, desserts, sugars, and sugary drinks.  Choose heart-healthy fats.  Limit cheese to 1 oz (28 g) per day.  Eat more home-cooked food and less restaurant, buffet, and fast food.  Limit fried foods.  Cook foods using methods other than frying.  Limit canned vegetables. If you do use them, rinse them well to decrease the sodium.  When eating at a restaurant, ask that your food be prepared with less salt, or no salt if possible. WHAT FOODS CAN I EAT? Seek help from a dietitian for individual calorie needs. Grains Whole grain or whole wheat bread. Brown rice.  Whole grain or whole wheat pasta. Quinoa, bulgur, and whole grain cereals. Low-sodium cereals. Corn or whole wheat flour tortillas. Whole grain cornbread. Whole grain crackers. Low-sodium crackers. Vegetables Fresh or frozen vegetables (raw, steamed, roasted, or grilled). Low-sodium or reduced-sodium tomato and vegetable juices. Low-sodium or reduced-sodium tomato sauce and paste. Low-sodium or reduced-sodium canned vegetables.  Fruits All fresh, canned (in natural juice), or frozen fruits. Meat and Other Protein Products Ground beef (85% or leaner), grass-fed beef, or beef trimmed of fat. Skinless chicken or [place]key. Ground chicken or [place]key. Pork trimmed of fat. All fish and seafood. Eggs. Dried beans, peas, or lentils. Unsalted nuts and seeds. Unsalted canned beans. Dairy Low-fat dairy products, such as skim or 1% milk, 2% or reduced-fat cheeses, low-fat ricotta or cottage cheese, or plain low-fat yogurt. Low-sodium or reduced-sodium cheeses. Fats and Oils Tub margarines without trans fats. Light or reduced-fat mayonnaise and salad dressings (reduced sodium). Avocado. Safflower, olive, or canola oils. Natural peanut or almond butter. Other Unsalted popcorn and pretzels. The items listed above may not be a complete list of recommended foods or beverages. Contact your dietitian for more options. WHAT FOODS ARE NOT RECOMMENDED? Grains White bread. White pasta. White rice. Refined cornbread. Bagels and croissants. Crackers that contain trans fat. Vegetables Creamed or fried vegetables. Vegetables in a cheese sauce. Regular canned vegetables. Regular canned tomato sauce and paste. Regular tomato and vegetable juices. Fruits Dried fruits. Canned fruit in light or heavy syrup. Fruit juice. Meat  and Other Protein Products Fatty cuts of meat. Ribs, chicken wings, bacon, sausage, bologna, salami, chitterlings, fatback, hot dogs, bratwurst, and packaged luncheon meats. Salted nuts and seeds. Canned  beans with salt. Dairy Whole or 2% milk, cream, half-and-half, and cream cheese. Whole-fat or sweetened yogurt. Full-fat cheeses or blue cheese. Nondairy creamers and whipped toppings. Processed cheese, cheese spreads, or cheese curds. Condiments Onion and garlic salt, seasoned salt, table salt, and sea salt. Canned and packaged gravies. Worcestershire sauce. Tartar sauce. Barbecue sauce. Teriyaki sauce. Soy sauce, including reduced sodium. Steak sauce. Fish sauce. Oyster sauce. Cocktail sauce. Horseradish. Ketchup and mustard. Meat flavorings and tenderizers. Bouillon cubes. Hot sauce. Tabasco sauce. Marinades. Taco seasonings. Relishes. Fats and Oils Butter, stick margarine, lard, shortening, ghee, and bacon fat. Coconut, palm kernel, or palm oils. Regular salad dressings. Other Pickles and olives. Salted popcorn and pretzels. The items listed above may not be a complete list of foods and beverages to avoid. Contact your dietitian for more information. WHERE CAN I FIND MORE INFORMATION? National Heart, Lung, and Blood Institute: CableProm[employer].itwww.nhlbi.nih.gov/health/health-topics/topics/dash/ Document Released: 04/29/2011 Document Revised: 09/24/2013 Document Reviewed: 03/14/2013 [facility] Patient Information 2015 [place]. This information is not intended to replace advice given to you by your health care provider. Make sure you discuss any questions you have with your health care provider.

## 2014-03-22 ENCOUNTER — Telehealth: Payer: Self-pay | Admitting: Family

## 2014-03-22 NOTE — Telephone Encounter (Signed)
emmi mailed  °

## 2014-03-31 ENCOUNTER — Encounter: Payer: Self-pay | Admitting: Family Medicine

## 2014-03-31 NOTE — Progress Notes (Signed)
Patient ID: Megan Morton, female   DOB: 04/15/1981, 33 y.o.   MRN: [id number] Megan AngelMary Magana [id number] 07/25/1980 03/31/2014      Progress Note-Follow Up  Subjective  Chief Complaint  Chief Complaint  Patient presents with  . Allergies    HPI  Patient is a 33 year old female in today for routine medical care. She is here to discuss change in PCP and medical concerns. Has continued to struggle with low back pain, it improves and worsens intermittently. No incontinence. Denies CP/palp/SOB/HA/congestion/fevers/GI or GU c/o. Taking meds as prescribed  Past Medical History  Diagnosis Date  . History of chicken pox   . Polycystic ovarian disease 11/23/2011  . 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 Obesity 11/12/2011    Past Surgical History  Procedure Laterality Date  . Wisdom tooth extraction  2014    Family History  Problem Relation Age of Onset  . Hypertension Mother   . Stroke Father   . Hypertension Father     History   Social History  . Marital Status: Married    Spouse Name: N/A    Number of Children: N/A  . Years of Education: N/A   Occupational History  . Not on file.   Social History Main Topics  . Smoking status: Current Some Day Smoker  . Smokeless tobacco: Never Used     Comment: hookah  . Alcohol Use: No  . Drug Use: No  . Sexual Activity:    Partners: Male   Other Topics Concern  . Not on file   Social History Narrative    No current outpatient prescriptions on file prior to visit.   No current facility-administered medications on file prior to visit.    No Known Allergies  Review of Systems  Review of Systems  Constitutional: Negative for fever and malaise/fatigue.  HENT: Positive for congestion.   Eyes: Negative for discharge.  Respiratory: Negative for shortness of breath.   Cardiovascular: Negative for chest pain, palpitations and leg swelling.  Gastrointestinal: Negative for nausea, abdominal pain and diarrhea.  Genitourinary: Negative for  dysuria.  Musculoskeletal: Positive for back pain. Negative for falls.  Skin: Negative for rash.  Neurological: Negative for loss of consciousness and headaches.  Endo/Heme/Allergies: Negative for polydipsia.  Psychiatric/Behavioral: Negative for depression and suicidal ideas. The patient is nervous/anxious. The patient does not have insomnia.     Objective  BP 139/76 mmHg  Pulse 98  Temp(Src) 98.5 F (36.9 C) (Oral)  Ht 5' 2.5" (1.588 m)  Wt 288 lb 3.2 oz (130.727 kg)  BMI 51.84 kg/m2  SpO2 98%  LMP 02/25/2014  Physical Exam  Physical Exam  Constitutional: She is oriented to person, place, and time and well-developed, well-nourished, and in no distress. No distress.  HENT:  Head: Normocephalic and atraumatic.  Eyes: Conjunctivae are normal.  Neck: Neck supple. No thyromegaly present.  Cardiovascular: Normal rate, regular rhythm and normal heart sounds.   No murmur heard. Pulmonary/Chest: Effort normal and breath sounds normal. She has no wheezes.  Abdominal: She exhibits no distension and no mass.  Musculoskeletal: She exhibits no edema.  Lymphadenopathy:    She has no cervical adenopathy.  Neurological: She is alert and oriented to person, place, and time.  Skin: Skin is warm and dry. No rash noted. She is not diaphoretic.  Psychiatric: Memory, affect and judgment normal.    No results found for: TSH Lab Results  Component Value Date   WBC 8.8 01/31/2012   HGB 13.3 01/31/2012  HCT 39.8 01/31/2012   MCV 79.0 01/31/2012   PLT 313 01/31/2012   Lab Results  Component Value Date   CREATININE 0.62 01/31/2012   BUN 12 01/31/2012   NA 136 01/31/2012   K 4.3 01/31/2012   CL 100 01/31/2012   CO2 26 01/31/2012   No results found for: ALT, AST, GGT, ALKPHOS, BILITOT No results found for: CHOL No results found for: HDL No results found for: LDLCALC No results found for: TRIG No results found for: CHOLHDL   Assessment & Plan  Allergic state No recent flares,  may user OTC meds prn  Low back pain radiating to right leg Encouraged moist heat and gentle stretching as tolerated. May try NSAIDs and prescription meds as directed and report if symptoms worsen or seek immediate care. Consider PT  Obesity Encouraged DASH diet, decrease po intake and increase exercise as tolerated. Needs 7-8 hours of sleep nightly. Avoid trans fats, eat small, frequent meals every 4-5 hours with lean proteins, complex carbs and healthy fats. Minimize simple carbs, GMO foods.  Bleeding hemorrhoids Not symptomatic at present. Encouraged increased hydration and fiber in diet. Daily probiotics. If bowels not moving can use MOM 2 tbls po in 4 oz of warm prune juice by mouth every 2-3 days. If no results then repeat in 4 hours with  Dulcolax suppository pr, may repeat again in 4 more hours as needed. Seek care if symptoms worsen. Consider daily Miralax and/or Dulcolax if symptoms persist.

## 2014-03-31 NOTE — Assessment & Plan Note (Signed)
Not symptomatic at present. Encouraged increased hydration and fiber in diet. Daily probiotics. If bowels not moving can use MOM 2 tbls po in 4 oz of warm prune juice by mouth every 2-3 days. If no results then repeat in 4 hours with  Dulcolax suppository pr, may repeat again in 4 more hours as needed. Seek care if symptoms worsen. Consider daily Miralax and/or Dulcolax if symptoms persist.

## 2014-03-31 NOTE — Assessment & Plan Note (Signed)
Encouraged DASH diet, decrease po intake and increase exercise as tolerated. Needs 7-8 hours of sleep nightly. Avoid trans fats, eat small, frequent meals every 4-5 hours with lean proteins, complex carbs and healthy fats. Minimize simple carbs, GMO foods. 

## 2014-03-31 NOTE — Assessment & Plan Note (Signed)
No recent flares, may user OTC meds prn

## 2014-03-31 NOTE — Assessment & Plan Note (Signed)
Encouraged moist heat and gentle stretching as tolerated. May try NSAIDs and prescription meds as directed and report if symptoms worsen or seek immediate care. Consider PT

## 2014-06-13 ENCOUNTER — Ambulatory Visit: Payer: Self-pay | Admitting: Podiatrist

## 2014-06-24 ENCOUNTER — Ambulatory Visit (INDEPENDENT_AMBULATORY_CARE_PROVIDER_SITE_OTHER): Payer: 59 | Admitting: Family

## 2014-06-24 ENCOUNTER — Encounter: Payer: Self-pay | Admitting: Family

## 2014-06-24 ENCOUNTER — Other Ambulatory Visit (HOSPITAL_COMMUNITY)
Admission: RE | Admit: 2014-06-24 | Discharge: 2014-06-24 | Disposition: A | Discharge status: Home / Self Care | Payer: 59 | Source: Ambulatory Visit | Attending: Family | Admitting: Family

## 2014-06-24 VITALS — BP 122/78 | HR 78 | Temp 98.4°F | Resp 16 | Wt 268.0 lb

## 2014-06-24 DIAGNOSIS — Z01419 Encounter for gynecological examination (general) (routine) without abnormal findings: Secondary | ICD-10-CM

## 2014-06-24 DIAGNOSIS — Z0000 Encounter for general adult medical examination without abnormal findings: Secondary | ICD-10-CM | POA: Insufficient documentation

## 2014-06-24 DIAGNOSIS — Z1151 Encounter for screening for human papillomavirus (HPV): Secondary | ICD-10-CM | POA: Diagnosis present

## 2014-06-24 DIAGNOSIS — L600 Ingrowing nail: Secondary | ICD-10-CM

## 2014-06-24 LAB — BASIC METABOLIC PANEL
BUN: 13 mg/dL (ref 6–23)
CHLORIDE: 102 meq/L (ref 96–112)
CO2: 26 mEq/L (ref 19–32)
Calcium: 9.3 mg/dL (ref 8.4–10.5)
Creatinine, Ser: 0.6 mg/dL (ref 0.40–1.20)
GFR: 123.26 mL/min (ref >60.00)
Glucose, Bld: 74 mg/dL (ref 70–99)
Potassium: 4.2 mEq/L (ref 3.5–5.1)
Sodium: 136 mEq/L (ref 135–145)

## 2014-06-24 LAB — LIPID PANEL
Cholesterol: 184 mg/dL (ref 0–200)
HDL: 44.7 mg/dL (ref >39.00)
LDL Cholesterol: 119 mg/dL — ABNORMAL HIGH (ref 0–99)
NONHDL: 139.3
TRIGLYCERIDES: 104 mg/dL (ref 0.0–149.0)
Total CHOL/HDL Ratio: 4
VLDL: 20.8 mg/dL (ref 0.0–40.0)

## 2014-06-24 LAB — URINALYSIS, ROUTINE W REFLEX MICROSCOPIC
Bilirubin Urine: NEGATIVE
Hgb urine dipstick: NEGATIVE
KETONES UR: NEGATIVE
LEUKOCYTES UA: NEGATIVE
NITRITE: NEGATIVE
PH: 6 (ref 5.0–8.0)
RBC / HPF: NONE SEEN (ref 0–?)
SPECIFIC GRAVITY, URINE: 1.025 (ref 1.000–1.030)
TOTAL PROTEIN, URINE-UPE24: NEGATIVE
Urine Glucose: NEGATIVE
Urobilinogen, UA: 0.2 (ref 0.0–1.0)
WBC, UA: NONE SEEN (ref 0–?)

## 2014-06-24 LAB — CBC WITH DIFFERENTIAL/PLATELET
BASOS PCT: 0.3 % (ref 0.0–3.0)
Basophils Absolute: 0 10*3/uL (ref 0.0–0.1)
Eosinophils Absolute: 0.1 10*3/uL (ref 0.0–0.7)
Eosinophils Relative: 1.2 % (ref 0.0–5.0)
HCT: 40.4 % (ref 36.0–46.0)
Hemoglobin: 13.5 g/dL (ref 12.0–15.0)
Lymphocytes Relative: 24.7 % (ref 12.0–46.0)
Lymphs Abs: 2.7 10*3/uL (ref 0.7–4.0)
MCHC: 33.4 g/dL (ref 30.0–36.0)
MCV: 79.5 fl (ref 78.0–100.0)
Monocytes Absolute: 0.5 10*3/uL (ref 0.1–1.0)
Monocytes Relative: 4.3 % (ref 3.0–12.0)
Neutro Abs: 7.5 10*3/uL (ref 1.4–7.7)
Neutrophils Relative %: 69.5 % (ref 43.0–77.0)
Platelets: 316 10*3/uL (ref 150.0–400.0)
RBC: 5.08 Mil/uL (ref 3.87–5.11)
RDW: 13.6 % (ref 11.5–15.5)
WBC: 10.8 10*3/uL — AB (ref 4.0–10.5)

## 2014-06-24 LAB — HEPATIC FUNCTION PANEL
ALBUMIN: 4.3 g/dL (ref 3.5–5.2)
ALT: 20 U/L (ref 0–35)
AST: 17 U/L (ref 0–37)
Alkaline Phosphatase: 72 U/L (ref 39–117)
Bilirubin, Direct: 0.1 mg/dL (ref 0.0–0.3)
TOTAL PROTEIN: 8 g/dL (ref 6.0–8.3)
Total Bilirubin: 0.4 mg/dL (ref 0.2–1.2)

## 2014-06-24 LAB — TSH: TSH: 2.25 u[IU]/mL (ref 0.35–4.50)

## 2014-06-24 MED ORDER — CEPHALEXIN 500 MG PO CAPS: 500.0000 mg | ORAL_CAPSULE | Freq: Two times a day (BID) | ORAL | Status: DC

## 2014-06-24 NOTE — Progress Notes (Signed)
Subjective:    Patient ID: Megan AngelMary Morton, female    DOB: 04/13/1981, 34 y.o.   MRN: [id number]  HPI  Patient presents today for complete physical.  Immunizations: declines flu shot Diet: reports that she is using AB slim from [place]  Exercise: she swims 2-3 times a week Pap Smear: 2 + years ago, wants to do today.  She is unsure if she has ever had an abnormal pap    Review of Systems  Constitutional: Negative for unexpected weight change.  HENT: Negative for rhinorrhea.   Respiratory: Negative for cough.   Gastrointestinal: Negative for nausea, vomiting and diarrhea.  Genitourinary: Negative for dysuria.       + painful periods  Musculoskeletal:       Reports chronic right shoulder pain, attributes to work, but has improved since she has lost weight  Neurological: Negative for headaches.  Hematological: Negative for adenopathy.  Psychiatric/Behavioral:       Denies depression/anxiety   Past Medical History  Diagnosis Date  . History of chicken pox   . Polycystic ovarian disease 11/23/2011  . 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 Obesity 11/12/2011    History   Social History  . Marital Status: Married    Spouse Name: N/A    Number of Children: N/A  . Years of Education: N/A   Occupational History  . Not on file.   Social History Main Topics  . Smoking status: Current Some Day Smoker  . Smokeless tobacco: Never Used     Comment: hookah  . Alcohol Use: No  . Drug Use: No  . Sexual Activity:    Partners: Male   Other Topics Concern  . 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    Past Surgical History  Procedure Laterality Date  . Wisdom tooth extraction  2014    Family History  Problem Relation Age of Onset  . Hypertension Mother   . Stroke Father   . Hypertension Father     No Known Allergies  No current outpatient prescriptions on file prior  to visit.   No current facility-administered medications on file prior to visit.    BP 122/78 mmHg  Pulse 78  Temp(Src) 98.4 F (36.9 C) (Oral)  Resp 16  Wt 268 lb (121.564 kg)  LMP 05/28/2014      Objective:   Physical Exam  Physical Exam  Constitutional: She is oriented to person, place, and time. She appears well-developed and well-nourished. No distress.  HENT:  Head: Normocephalic and atraumatic.  Right Ear: Tympanic membrane and ear canal normal.  Left Ear: Tympanic membrane and ear canal normal.  Mouth/Throat: Oropharynx is clear and moist.  Eyes: Pupils are equal, round, and reactive to light. No scleral icterus.  Neck: Normal range of motion. No thyromegaly present.  Cardiovascular: Normal rate and regular rhythm.   No murmur heard. Pulmonary/Chest: Effort normal and breath sounds normal. No respiratory distress. He has no wheezes. She has no rales. She exhibits no tenderness.  Abdominal: Soft. Bowel sounds are normal. He exhibits no distension and no mass. There is no tenderness. There is no rebound and no guarding.  Musculoskeletal: She exhibits no edema.  Lymphadenopathy:    She has no cervical adenopathy.  Neurological: She is alert and oriented to person, place, and time.  She exhibits normal  muscle tone. Coordination normal.  Skin: Skin is warm and dry. right great toe with some erythema noted surrounding left nailbed Psychiatric: She has a normal mood and affect. Her behavior is normal. Judgment and thought content normal.  Breasts: Examined lying Right: Without masses, retractions, discharge or axillary adenopathy.  Left: Without masses, retractions, discharge or axillary adenopathy.  Inguinal/mons: Normal without inguinal adenopathy  External genitalia: Normal  BUS/Urethra/Skene's glands: Normal  Bladder: Normal  Vagina: Normal  Cervix: Normal  Uterus: normal in size, shape and contour. Midline and mobile  Adnexa/parametria:  Rt: Without masses or  tenderness.  Lt: Without masses or tenderness.  Anus and perineum: Normal           Assessment & Plan:         Assessment & Plan:

## 2014-06-24 NOTE — Patient Instructions (Signed)
Please complete your lab work prior to leaving. Continue your work with diet/exercise weight loss. Follow up in 1 year, sooner if problems/concerns.

## 2014-06-24 NOTE — Assessment & Plan Note (Signed)
Start keflex, soak feet bid.  Refer to podiatry.

## 2014-06-24 NOTE — Addendum Note (Signed)
Addended by: Mervin KungFERGERSON, Shalonda Sachse A on: 06/24/2014 10:53 AM   Modules accepted: Orders

## 2014-06-24 NOTE — Assessment & Plan Note (Signed)
Continue exercise, weight loss. Pap, lab work today.

## 2014-06-26 ENCOUNTER — Encounter: Payer: Self-pay | Admitting: Family

## 2014-06-26 LAB — CYTOLOGY - PAP

## 2014-06-29 ENCOUNTER — Telehealth: Payer: Self-pay | Admitting: Family

## 2014-06-29 NOTE — Telephone Encounter (Signed)
Please remind pt to schedule a lab visit.  Needs labs drawn. She has upcoming apt for 30 min with Dr. Abner GreenspanBlyth that was made last fall.  Not sure she needs that visit since she just had cpx.  It is up to her.

## 2014-07-01 NOTE — Telephone Encounter (Signed)
Notified pt and she voices understanding. Pt states she will have to ask her husband about her upcoming appt with Dr Abner GreenspanBlyth and will call us back.

## 2014-07-01 NOTE — Telephone Encounter (Signed)
Spoke with pt she states labs were completed on 06/24/14. Results are in EPIC.  Is there something else we need to draw?

## 2014-07-01 NOTE — Telephone Encounter (Signed)
Pt called back and states she would like to cancel upcoming appt in 08/2014 and she is going to find a new doctor elsewhere.

## 2014-07-01 NOTE — Telephone Encounter (Signed)
Noted & all set

## 2014-07-12 ENCOUNTER — Encounter: Payer: Self-pay | Admitting: Podiatry

## 2014-07-12 ENCOUNTER — Ambulatory Visit (INDEPENDENT_AMBULATORY_CARE_PROVIDER_SITE_OTHER): Payer: 59 | Admitting: Podiatry

## 2014-07-12 VITALS — Ht 62.0 in | Wt 259.0 lb

## 2014-07-12 DIAGNOSIS — L600 Ingrowing nail: Secondary | ICD-10-CM

## 2014-07-12 NOTE — Progress Notes (Signed)
Patient came in with ingrown nail.  Explained possible available options. Patient and care taker requested surgical correction. Consent form left in the room for the patient to review while getting supply for the procedure. Patient and care take left the office without explanation.

## 2014-09-20 ENCOUNTER — Ambulatory Visit: Payer: BC Managed Care – PPO | Admitting: Family Medicine

## 2014-10-16 ENCOUNTER — Telehealth: Payer: Self-pay | Admitting: Medical

## 2014-10-16 ENCOUNTER — Ambulatory Visit (INDEPENDENT_AMBULATORY_CARE_PROVIDER_SITE_OTHER): Payer: 59 | Admitting: Medical

## 2014-10-16 ENCOUNTER — Encounter: Payer: Self-pay | Admitting: Medical

## 2014-10-16 VITALS — BP 120/90 | HR 84 | Temp 98.4°F | Resp 15 | Wt 254.0 lb

## 2014-10-16 DIAGNOSIS — J309 Allergic rhinitis, unspecified: Secondary | ICD-10-CM | POA: Diagnosis not present

## 2014-10-16 DIAGNOSIS — R1013 Epigastric pain: Secondary | ICD-10-CM

## 2014-10-16 DIAGNOSIS — B9681 Helicobacter pylori [H. pylori] as the cause of diseases classified elsewhere: Secondary | ICD-10-CM

## 2014-10-16 DIAGNOSIS — A048 Other specified bacterial intestinal infections: Secondary | ICD-10-CM

## 2014-10-16 DIAGNOSIS — Z9289 Personal history of other medical treatment: Secondary | ICD-10-CM

## 2014-10-16 DIAGNOSIS — R7611 Nonspecific reaction to tuberculin skin test without active tuberculosis: Secondary | ICD-10-CM | POA: Diagnosis not present

## 2014-10-16 LAB — POCT URINALYSIS DIPSTICK
Bilirubin, UA: NEGATIVE
Blood, UA: NEGATIVE
GLUCOSE UA: NEGATIVE
Ketones, UA: NEGATIVE
Leukocytes, UA: NEGATIVE
Nitrite, UA: NEGATIVE
PROTEIN UA: NEGATIVE
Spec Grav, UA: 1.015
UROBILINOGEN UA: NEGATIVE
pH, UA: 8

## 2014-10-16 MED ORDER — FLUTICASONE PROPIONATE 50 MCG/ACT NA SUSP: 2.0000 | Freq: Every day | NASAL | Status: AC

## 2014-10-16 NOTE — Telephone Encounter (Signed)
Refer to GI for chronic epigastric pain, recent treatment for H pylori

## 2014-10-16 NOTE — Progress Notes (Addendum)
Subjective: Chief Complaint  Patient presents with  . new patient establish care    ua running  . stomach pain and gas    nasal drainage   Here as a new patient, her husband started care here recently.  May 8th, had severe pain in abdomen.  Drank some milk and this made it worse.  Has lactose intolerance.  Went to urgent care, told she had virus.  Was put on medication for H Pylor.  She has completed the 2 antibiotics, and continues on the Aciphex/Rabeprazole.   Since last weekend has been fine.  Prior to this had severe pain in the upper abdomen.   Was on Clarithromycin 500mg  1 tablet BID x 5 days and Amoxicillin  500mg  BID x 5 days.   She is improved, but not a lot better. She notes prior several similar episodes in past few years of significant epigastric pains.    Swims 3 times per week.   On those days has lots of sneezing, stuffy nose, and lots of mucous from chest in the mornings.  Using Claritin tablets, but afraid to do neti pot or other treatment.  She has questions about PPD screening.  In the past possibly had +ppd but isn't sure.   Was given a [facility]medicationby [facility] but she never went for f/u or treatment.  She does report mucous in chest/throat most mornings  No other new issues.    Objective: BP 120/90 mmHg  Pulse 84  Temp(Src) 98.4 F (36.9 C) (Oral)  Resp 15  Wt 254 lb (115.214 kg)  General appearance: alert, no distress, WD/WN HEENT: normocephalic, sclerae anicteric, TMs pearly, nares patent, no discharge or erythema, pharynx normal Oral cavity: MMM, no lesions Neck: supple, no lymphadenopathy, no thyromegaly, no masses Heart: RRR, normal S1, S2, no murmurs Lungs: CTA bilaterally, no wheezes, rhonchi, or rales Abdomen: +bs, soft, +epigastric tenderness, nontender otherwise,  non distended, no masses, no hepatomegaly, no splenomegaly Pulses: 2+ symmetric, upper and lower extremities, normal cap refill Ext: no edema   Assessment: Encounter Diagnoses  Name  Primary?  . Helicobacter pylori (H. pylori) infection Yes  . Abdominal pain, epigastric   . Allergic rhinitis, unspecified allergic rhinitis type   . History of positive PPD     Plan: Reviewed recent records in chart as well as physical records and labs from 06/2014.   H pylori, abdominal pain - reviewed recent urgent care notes, recent treatment, still taking Aciphex.  Referral to GI at her request allergic rhinitis - continue Claritin daily, begin flonase nasal Possible prior PPD +.  Will get health dept records and then can recommend next steps.   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

## 2014-10-16 NOTE — Telephone Encounter (Signed)
Yesterday we requested prior health dept records to see about prior +PPD and eval and treatment from health dept.  I got a form back from health dept but nothing is checked.  Please call health dept to see if they have a record of prior eval/treatment for +PPD?

## 2014-10-16 NOTE — Patient Instructions (Signed)
Encounter Diagnoses  Name Primary?  . Helicobacter pylori (H. pylori) infection Yes  . Abdominal pain, epigastric   . Allergic rhinitis, unspecified allergic rhinitis type   . History of positive PPD     Recommendations:  Continue the Rabiprazole daily  Avoid acidic foods such as citrus and peppers and tomato based foods, avoid fried foods, large portions, avoid spicy foods  Drink water primarily  Avoid lots of caffeine and coffee  We will refer you to gastroenterology  We are requesting records for possible prior +PPD.  If you were + for PPD, then you will be recommended by the health department to start treatment for latent TB  Allergies   Begin flonase nasal spray twice daily  continue Claritin tablet daily  Consider using nasal saline flush daily, or at least on the pool days  Consider taking a daily probiotic such as Acidophilus or Align OTC  You had a physical in February at [facility] and I have reviewed those labs.

## 2014-10-16 NOTE — Addendum Note (Signed)
Addended by: Jac CanavanYSINGER, DAVID S on: 10/16/2014 11:55 AM   Modules accepted: Orders

## 2014-10-17 NOTE — Telephone Encounter (Signed)
Sent release and info to Tammy at [phone]. She has patient with las name of Donnetta SimpersYousef, and she will send us the information requested.

## 2014-10-17 NOTE — Telephone Encounter (Signed)
Sent referral to [facility] for them to pick up in system and call pt and schedule

## 2014-10-22 ENCOUNTER — Telehealth: Payer: Self-pay | Admitting: Medical

## 2014-10-22 ENCOUNTER — Encounter: Payer: 59 | Admitting: Medical

## 2014-10-22 NOTE — Telephone Encounter (Signed)
I did received records from the Health Dept.   It appears she was PPD+, and was recommended to complete treatment for latent TB per their protocol.   She doesn't seem to think she did any of the treatment.   Thus, please refer back to the health dept for treatment of latent TB.

## 2014-10-22 NOTE — Telephone Encounter (Signed)
Lmom to cb. cls

## 2014-10-30 ENCOUNTER — Encounter: Payer: Self-pay | Admitting: Medical

## 2014-11-04 NOTE — Telephone Encounter (Signed)
LMOM TO CB. CLS 

## 2014-11-05 NOTE — Telephone Encounter (Signed)
I fax the patient's information to Tammy at the [facility] health department fax# [phone] and ask Tammy the PPD nurse to contact the patient about +PPD and not treatment

## 2014-11-06 ENCOUNTER — Encounter: Payer: Self-pay | Admitting: Medical

## 2015-05-20 ENCOUNTER — Ambulatory Visit
Admission: RE | Admit: 2015-05-20 | Discharge: 2015-05-20 | Disposition: A | Discharge status: Home / Self Care | Payer: 59 | Source: Ambulatory Visit | Attending: Medical | Admitting: Medical

## 2015-05-20 ENCOUNTER — Ambulatory Visit (INDEPENDENT_AMBULATORY_CARE_PROVIDER_SITE_OTHER): Payer: 59 | Admitting: Medical

## 2015-05-20 ENCOUNTER — Encounter: Payer: Self-pay | Admitting: Medical

## 2015-05-20 VITALS — BP 130/100 | HR 96 | Wt 265.0 lb

## 2015-05-20 DIAGNOSIS — R030 Elevated blood-pressure reading, without diagnosis of hypertension: Secondary | ICD-10-CM

## 2015-05-20 DIAGNOSIS — S99921A Unspecified injury of right foot, initial encounter: Secondary | ICD-10-CM

## 2015-05-20 DIAGNOSIS — IMO0001 Reserved for inherently not codable concepts without codable children: Secondary | ICD-10-CM

## 2015-05-20 DIAGNOSIS — Z23 Encounter for immunization: Secondary | ICD-10-CM | POA: Diagnosis not present

## 2015-05-20 NOTE — Progress Notes (Signed)
Subjective: Chief Complaint  Patient presents with  . rt pinky toe    states that she opened a restaraunt door and hit the door on her toe. has a cut near the nail and bruised at the base. 0-10 scale she rates it 8 (in motion)    Here for right toe injury.   Last night about 8pm pulled opened a door at a restaurant, pulled the door onto foot.  This injured right pinky toe.   She was wearing sandals. Now has purplish right small toe.   She currently notes pain, bruising, swelling, and a small wound.  No other aggravating or relieving factors. No other complaint.  Past Medical History  Diagnosis Date  . History of chicken pox   . Polycystic ovarian disease 11/23/2011  . Allergic state 11/12/2011    allergic rhinitis  . Obesity 11/12/2011   ROS as in subjective  Objective: BP 130/100 mmHg  Pulse 96  Wt 265 lb (120.203 kg)  LMP 04/18/2015  BP Readings from Last 3 Encounters:  05/20/15 130/100  10/16/14 120/90  06/24/14 122/78   Wt Readings from Last 3 Encounters:  05/20/15 265 lb (120.203 kg)  10/16/14 254 lb (115.214 kg)  07/12/14 259 lb (117.482 kg)   General appearance: alert, no distress, WD/WN Right 5th toe with purplish ecchymosis and swelling from MTP to the DIP.  Tender in same area with limited ROM.   There is a small superficial abrasion just proximal to nail bed on dorsal distal 5th toe phalanx.  Otherwise toes and feet non tender, no deformity otherwise, normal ROM of feet and toes otherwise, toes neurovascularly intact.  Oral cavity: MMM Neck: supple, no lymphadenopathy, no thyromegaly, no masses Heart: RRR, normal S1, S2, no murmurs Lungs: CTA bilaterally, no wheezes, rhonchi, or rales Ext: no edema Pulses: 2+ symmetric, upper and lower extremities, normal cap refill Neuro: CN 2-12 intact, non focal exam    Assessment: Encounter Diagnoses  Name Primary?  . Toe injury, right, initial encounter Yes  . Need for Tdap vaccination   . Elevated blood pressure      Plan: Toe injury - likely contusion vs fracture and small superficial abrasion.   discussed wound care, protecting the toe.   Go for xray, gave script for post op shoe.  Discussed ice, elevation, avoiding re injury, and usual time frame for contusion to heal.   Counseled on the Tdap (tetanus, diptheria, and acellular pertussis) vaccine.  Vaccine information sheet given. Tdap vaccine given after consent obtained.  Elevated BP -  discussed finding of elevate BP today, possible causes.  Prior BP readings in chart mostly normal.  She is actually in pain today.  Advised monitoring BP at drug store, and recheck in 2wk.     Patient Instructions  Recommendations:  We updated your Tdap, tetanus diptheria and pertussis booster today since you have a wound  Go for xray of the toe  If the xray shows no fracture then I want you to go get the Post Op shoe to protect the foot.   We will call with results and other recommendations  If no fracture, we will have you use the post op shoe for 2-3 weeks to protect the toe and avoid re injury  If no fracture, you can buddy tape the 5th toe to the 4th toe  Use ice for 20 minutes three times daily along with foot elevation  Avoid re injury of the toe  Keep the toe clean with soap and  water  You can use OTC Neosporin for the small wound to the toe  You can use Ibuprofen or Tylenol OTC for pain  Plan to follow up in 2 weeks if no fracture  Regarding elevated blood pressures, lets recheck in 2 weeks on this as well.    In the mean time, check your blood pressure a few times per week at a local pharmacy and write the numbers down  Normal is 120-130 top number, 80-90 bottom number  Avoid salt and lots of sweets

## 2015-05-20 NOTE — Patient Instructions (Signed)
Recommendations:  We updated your Tdap, tetanus diptheria and pertussis booster today since you have a wound  Go for xray of the toe  If the xray shows no fracture then I want you to go get the Post Op shoe to protect the foot.   We will call with results and other recommendations  If no fracture, we will have you use the post op shoe for 2-3 weeks to protect the toe and avoid re injury  If no fracture, you can buddy tape the 5th toe to the 4th toe  Use ice for 20 minutes three times daily along with foot elevation  Avoid re injury of the toe  Keep the toe clean with soap and water  You can use OTC Neosporin for the small wound to the toe  You can use Ibuprofen or Tylenol OTC for pain  Plan to follow up in 2 weeks if no fracture  Regarding elevated blood pressures, lets recheck in 2 weeks on this as well.    In the mean time, check your blood pressure a few times per week at a local pharmacy and write the numbers down  Normal is 120-130 top number, 80-90 bottom number  Avoid salt and lots of sweets

## 2015-06-03 ENCOUNTER — Ambulatory Visit: Payer: 59 | Admitting: Medical

## 2015-10-31 DIAGNOSIS — H538 Other visual disturbances: Secondary | ICD-10-CM | POA: Diagnosis not present

## 2015-10-31 DIAGNOSIS — R51 Headache: Secondary | ICD-10-CM | POA: Diagnosis not present

## 2015-11-12 DIAGNOSIS — Z0000 Encounter for general adult medical examination without abnormal findings: Secondary | ICD-10-CM | POA: Diagnosis not present

## 2015-11-12 DIAGNOSIS — Z1389 Encounter for screening for other disorder: Secondary | ICD-10-CM | POA: Diagnosis not present

## 2015-11-12 DIAGNOSIS — H04123 Dry eye syndrome of bilateral lacrimal glands: Secondary | ICD-10-CM | POA: Diagnosis not present

## 2015-12-11 ENCOUNTER — Other Ambulatory Visit: Payer: Self-pay | Admitting: Obstetrics and Gynecology

## 2015-12-11 ENCOUNTER — Other Ambulatory Visit (HOSPITAL_COMMUNITY)
Admission: RE | Admit: 2015-12-11 | Discharge: 2015-12-11 | Disposition: A | Discharge status: Home / Self Care | Payer: BLUE CROSS/BLUE SHIELD | Source: Ambulatory Visit | Attending: Obstetrics and Gynecology | Admitting: Obstetrics and Gynecology

## 2015-12-11 DIAGNOSIS — E282 Polycystic ovarian syndrome: Secondary | ICD-10-CM | POA: Diagnosis not present

## 2015-12-11 DIAGNOSIS — R87616 Satisfactory cervical smear but lacking transformation zone: Secondary | ICD-10-CM | POA: Diagnosis not present

## 2015-12-11 DIAGNOSIS — Z01419 Encounter for gynecological examination (general) (routine) without abnormal findings: Secondary | ICD-10-CM | POA: Insufficient documentation

## 2015-12-15 LAB — CYTOLOGY - PAP

## 2015-12-16 DIAGNOSIS — E282 Polycystic ovarian syndrome: Secondary | ICD-10-CM | POA: Diagnosis not present

## 2015-12-30 DIAGNOSIS — E282 Polycystic ovarian syndrome: Secondary | ICD-10-CM | POA: Diagnosis not present

## 2016-02-21 DIAGNOSIS — M5430 Sciatica, unspecified side: Secondary | ICD-10-CM | POA: Diagnosis not present

## 2016-02-21 DIAGNOSIS — M9903 Segmental and somatic dysfunction of lumbar region: Secondary | ICD-10-CM | POA: Diagnosis not present

## 2016-02-21 DIAGNOSIS — M4716 Other spondylosis with myelopathy, lumbar region: Secondary | ICD-10-CM | POA: Diagnosis not present

## 2016-03-05 DIAGNOSIS — M25551 Pain in right hip: Secondary | ICD-10-CM | POA: Diagnosis not present

## 2016-03-05 DIAGNOSIS — M47816 Spondylosis without myelopathy or radiculopathy, lumbar region: Secondary | ICD-10-CM | POA: Diagnosis not present

## 2016-03-05 DIAGNOSIS — M5137 Other intervertebral disc degeneration, lumbosacral region: Secondary | ICD-10-CM | POA: Diagnosis not present

## 2016-03-05 DIAGNOSIS — M461 Sacroiliitis, not elsewhere classified: Secondary | ICD-10-CM | POA: Diagnosis not present

## 2016-03-05 DIAGNOSIS — M25552 Pain in left hip: Secondary | ICD-10-CM | POA: Diagnosis not present

## 2016-03-05 DIAGNOSIS — M545 Low back pain: Secondary | ICD-10-CM | POA: Diagnosis not present

## 2016-03-05 DIAGNOSIS — M791 Myalgia: Secondary | ICD-10-CM | POA: Diagnosis not present

## 2016-03-05 DIAGNOSIS — M5127 Other intervertebral disc displacement, lumbosacral region: Secondary | ICD-10-CM | POA: Diagnosis not present

## 2016-03-10 DIAGNOSIS — M545 Low back pain: Secondary | ICD-10-CM | POA: Diagnosis not present

## 2016-03-10 DIAGNOSIS — M461 Sacroiliitis, not elsewhere classified: Secondary | ICD-10-CM | POA: Diagnosis not present

## 2016-03-10 DIAGNOSIS — M791 Myalgia: Secondary | ICD-10-CM | POA: Diagnosis not present

## 2016-03-10 DIAGNOSIS — M5127 Other intervertebral disc displacement, lumbosacral region: Secondary | ICD-10-CM | POA: Diagnosis not present

## 2016-03-10 DIAGNOSIS — M5137 Other intervertebral disc degeneration, lumbosacral region: Secondary | ICD-10-CM | POA: Diagnosis not present

## 2016-03-10 DIAGNOSIS — M47816 Spondylosis without myelopathy or radiculopathy, lumbar region: Secondary | ICD-10-CM | POA: Diagnosis not present

## 2016-03-15 DIAGNOSIS — M47816 Spondylosis without myelopathy or radiculopathy, lumbar region: Secondary | ICD-10-CM | POA: Diagnosis not present

## 2016-03-15 DIAGNOSIS — M791 Myalgia: Secondary | ICD-10-CM | POA: Diagnosis not present

## 2016-03-15 DIAGNOSIS — M5127 Other intervertebral disc displacement, lumbosacral region: Secondary | ICD-10-CM | POA: Diagnosis not present

## 2016-03-15 DIAGNOSIS — M461 Sacroiliitis, not elsewhere classified: Secondary | ICD-10-CM | POA: Diagnosis not present

## 2016-03-17 DIAGNOSIS — M791 Myalgia: Secondary | ICD-10-CM | POA: Diagnosis not present

## 2016-03-17 DIAGNOSIS — M461 Sacroiliitis, not elsewhere classified: Secondary | ICD-10-CM | POA: Diagnosis not present

## 2016-03-17 DIAGNOSIS — M47816 Spondylosis without myelopathy or radiculopathy, lumbar region: Secondary | ICD-10-CM | POA: Diagnosis not present

## 2016-03-17 DIAGNOSIS — M545 Low back pain: Secondary | ICD-10-CM | POA: Diagnosis not present

## 2016-03-17 DIAGNOSIS — M5127 Other intervertebral disc displacement, lumbosacral region: Secondary | ICD-10-CM | POA: Diagnosis not present

## 2016-03-19 DIAGNOSIS — M5127 Other intervertebral disc displacement, lumbosacral region: Secondary | ICD-10-CM | POA: Diagnosis not present

## 2016-03-19 DIAGNOSIS — M47816 Spondylosis without myelopathy or radiculopathy, lumbar region: Secondary | ICD-10-CM | POA: Diagnosis not present

## 2016-03-19 DIAGNOSIS — M791 Myalgia: Secondary | ICD-10-CM | POA: Diagnosis not present

## 2016-03-19 DIAGNOSIS — M545 Low back pain: Secondary | ICD-10-CM | POA: Diagnosis not present

## 2016-03-19 DIAGNOSIS — M461 Sacroiliitis, not elsewhere classified: Secondary | ICD-10-CM | POA: Diagnosis not present

## 2016-03-22 DIAGNOSIS — M5127 Other intervertebral disc displacement, lumbosacral region: Secondary | ICD-10-CM | POA: Diagnosis not present

## 2016-03-22 DIAGNOSIS — M47816 Spondylosis without myelopathy or radiculopathy, lumbar region: Secondary | ICD-10-CM | POA: Diagnosis not present

## 2016-03-22 DIAGNOSIS — M791 Myalgia: Secondary | ICD-10-CM | POA: Diagnosis not present

## 2016-03-22 DIAGNOSIS — M461 Sacroiliitis, not elsewhere classified: Secondary | ICD-10-CM | POA: Diagnosis not present

## 2016-03-24 DIAGNOSIS — M5127 Other intervertebral disc displacement, lumbosacral region: Secondary | ICD-10-CM | POA: Diagnosis not present

## 2016-03-24 DIAGNOSIS — M461 Sacroiliitis, not elsewhere classified: Secondary | ICD-10-CM | POA: Diagnosis not present

## 2016-03-24 DIAGNOSIS — M545 Low back pain: Secondary | ICD-10-CM | POA: Diagnosis not present

## 2016-03-24 DIAGNOSIS — M791 Myalgia: Secondary | ICD-10-CM | POA: Diagnosis not present

## 2016-03-24 DIAGNOSIS — M47816 Spondylosis without myelopathy or radiculopathy, lumbar region: Secondary | ICD-10-CM | POA: Diagnosis not present

## 2016-03-29 DIAGNOSIS — M545 Low back pain: Secondary | ICD-10-CM | POA: Diagnosis not present

## 2016-03-29 DIAGNOSIS — M47816 Spondylosis without myelopathy or radiculopathy, lumbar region: Secondary | ICD-10-CM | POA: Diagnosis not present

## 2016-03-29 DIAGNOSIS — M5127 Other intervertebral disc displacement, lumbosacral region: Secondary | ICD-10-CM | POA: Diagnosis not present

## 2016-03-29 DIAGNOSIS — M791 Myalgia: Secondary | ICD-10-CM | POA: Diagnosis not present

## 2016-03-29 DIAGNOSIS — M461 Sacroiliitis, not elsewhere classified: Secondary | ICD-10-CM | POA: Diagnosis not present

## 2016-03-31 DIAGNOSIS — M461 Sacroiliitis, not elsewhere classified: Secondary | ICD-10-CM | POA: Diagnosis not present

## 2016-03-31 DIAGNOSIS — M791 Myalgia: Secondary | ICD-10-CM | POA: Diagnosis not present

## 2016-03-31 DIAGNOSIS — M47816 Spondylosis without myelopathy or radiculopathy, lumbar region: Secondary | ICD-10-CM | POA: Diagnosis not present

## 2016-03-31 DIAGNOSIS — M5127 Other intervertebral disc displacement, lumbosacral region: Secondary | ICD-10-CM | POA: Diagnosis not present

## 2016-04-05 DIAGNOSIS — M791 Myalgia: Secondary | ICD-10-CM | POA: Diagnosis not present

## 2016-04-05 DIAGNOSIS — M461 Sacroiliitis, not elsewhere classified: Secondary | ICD-10-CM | POA: Diagnosis not present

## 2016-04-05 DIAGNOSIS — M47816 Spondylosis without myelopathy or radiculopathy, lumbar region: Secondary | ICD-10-CM | POA: Diagnosis not present

## 2016-04-05 DIAGNOSIS — M545 Low back pain: Secondary | ICD-10-CM | POA: Diagnosis not present

## 2016-04-05 DIAGNOSIS — M5127 Other intervertebral disc displacement, lumbosacral region: Secondary | ICD-10-CM | POA: Diagnosis not present

## 2016-04-07 DIAGNOSIS — M791 Myalgia: Secondary | ICD-10-CM | POA: Diagnosis not present

## 2016-04-07 DIAGNOSIS — M5127 Other intervertebral disc displacement, lumbosacral region: Secondary | ICD-10-CM | POA: Diagnosis not present

## 2016-04-07 DIAGNOSIS — M461 Sacroiliitis, not elsewhere classified: Secondary | ICD-10-CM | POA: Diagnosis not present

## 2016-04-07 DIAGNOSIS — M47816 Spondylosis without myelopathy or radiculopathy, lumbar region: Secondary | ICD-10-CM | POA: Diagnosis not present

## 2016-04-09 DIAGNOSIS — M791 Myalgia: Secondary | ICD-10-CM | POA: Diagnosis not present

## 2016-04-09 DIAGNOSIS — M545 Low back pain: Secondary | ICD-10-CM | POA: Diagnosis not present

## 2016-04-09 DIAGNOSIS — M461 Sacroiliitis, not elsewhere classified: Secondary | ICD-10-CM | POA: Diagnosis not present

## 2016-04-09 DIAGNOSIS — M47816 Spondylosis without myelopathy or radiculopathy, lumbar region: Secondary | ICD-10-CM | POA: Diagnosis not present

## 2016-04-09 DIAGNOSIS — M5127 Other intervertebral disc displacement, lumbosacral region: Secondary | ICD-10-CM | POA: Diagnosis not present

## 2016-04-12 DIAGNOSIS — M47816 Spondylosis without myelopathy or radiculopathy, lumbar region: Secondary | ICD-10-CM | POA: Diagnosis not present

## 2016-04-12 DIAGNOSIS — M791 Myalgia: Secondary | ICD-10-CM | POA: Diagnosis not present

## 2016-04-12 DIAGNOSIS — M545 Low back pain: Secondary | ICD-10-CM | POA: Diagnosis not present

## 2016-04-12 DIAGNOSIS — M5127 Other intervertebral disc displacement, lumbosacral region: Secondary | ICD-10-CM | POA: Diagnosis not present

## 2016-04-12 DIAGNOSIS — M461 Sacroiliitis, not elsewhere classified: Secondary | ICD-10-CM | POA: Diagnosis not present

## 2016-04-13 DIAGNOSIS — M791 Myalgia: Secondary | ICD-10-CM | POA: Diagnosis not present

## 2016-04-13 DIAGNOSIS — M5023 Other cervical disc displacement, cervicothoracic region: Secondary | ICD-10-CM | POA: Diagnosis not present

## 2016-04-13 DIAGNOSIS — M461 Sacroiliitis, not elsewhere classified: Secondary | ICD-10-CM | POA: Diagnosis not present

## 2016-04-13 DIAGNOSIS — M47812 Spondylosis without myelopathy or radiculopathy, cervical region: Secondary | ICD-10-CM | POA: Diagnosis not present

## 2016-04-14 DIAGNOSIS — M461 Sacroiliitis, not elsewhere classified: Secondary | ICD-10-CM | POA: Diagnosis not present

## 2016-04-14 DIAGNOSIS — M542 Cervicalgia: Secondary | ICD-10-CM | POA: Diagnosis not present

## 2016-04-14 DIAGNOSIS — M5023 Other cervical disc displacement, cervicothoracic region: Secondary | ICD-10-CM | POA: Diagnosis not present

## 2016-04-14 DIAGNOSIS — M791 Myalgia: Secondary | ICD-10-CM | POA: Diagnosis not present

## 2016-04-14 DIAGNOSIS — M47812 Spondylosis without myelopathy or radiculopathy, cervical region: Secondary | ICD-10-CM | POA: Diagnosis not present

## 2016-04-21 DIAGNOSIS — M791 Myalgia: Secondary | ICD-10-CM | POA: Diagnosis not present

## 2016-04-21 DIAGNOSIS — M47812 Spondylosis without myelopathy or radiculopathy, cervical region: Secondary | ICD-10-CM | POA: Diagnosis not present

## 2016-04-21 DIAGNOSIS — M461 Sacroiliitis, not elsewhere classified: Secondary | ICD-10-CM | POA: Diagnosis not present

## 2016-04-21 DIAGNOSIS — M5023 Other cervical disc displacement, cervicothoracic region: Secondary | ICD-10-CM | POA: Diagnosis not present

## 2016-04-26 DIAGNOSIS — M542 Cervicalgia: Secondary | ICD-10-CM | POA: Diagnosis not present

## 2016-04-26 DIAGNOSIS — M461 Sacroiliitis, not elsewhere classified: Secondary | ICD-10-CM | POA: Diagnosis not present

## 2016-04-26 DIAGNOSIS — M47812 Spondylosis without myelopathy or radiculopathy, cervical region: Secondary | ICD-10-CM | POA: Diagnosis not present

## 2016-04-26 DIAGNOSIS — M791 Myalgia: Secondary | ICD-10-CM | POA: Diagnosis not present

## 2016-04-26 DIAGNOSIS — M5023 Other cervical disc displacement, cervicothoracic region: Secondary | ICD-10-CM | POA: Diagnosis not present

## 2016-04-28 DIAGNOSIS — M791 Myalgia: Secondary | ICD-10-CM | POA: Diagnosis not present

## 2016-04-28 DIAGNOSIS — M461 Sacroiliitis, not elsewhere classified: Secondary | ICD-10-CM | POA: Diagnosis not present

## 2016-04-28 DIAGNOSIS — M5023 Other cervical disc displacement, cervicothoracic region: Secondary | ICD-10-CM | POA: Diagnosis not present

## 2016-04-28 DIAGNOSIS — M47812 Spondylosis without myelopathy or radiculopathy, cervical region: Secondary | ICD-10-CM | POA: Diagnosis not present

## 2016-04-28 DIAGNOSIS — M542 Cervicalgia: Secondary | ICD-10-CM | POA: Diagnosis not present

## 2016-05-06 DIAGNOSIS — M542 Cervicalgia: Secondary | ICD-10-CM | POA: Diagnosis not present

## 2016-05-06 DIAGNOSIS — M5023 Other cervical disc displacement, cervicothoracic region: Secondary | ICD-10-CM | POA: Diagnosis not present

## 2016-05-06 DIAGNOSIS — M47812 Spondylosis without myelopathy or radiculopathy, cervical region: Secondary | ICD-10-CM | POA: Diagnosis not present

## 2016-05-06 DIAGNOSIS — M461 Sacroiliitis, not elsewhere classified: Secondary | ICD-10-CM | POA: Diagnosis not present

## 2016-05-06 DIAGNOSIS — M791 Myalgia: Secondary | ICD-10-CM | POA: Diagnosis not present

## 2016-05-07 DIAGNOSIS — M47812 Spondylosis without myelopathy or radiculopathy, cervical region: Secondary | ICD-10-CM | POA: Diagnosis not present

## 2016-05-07 DIAGNOSIS — M5023 Other cervical disc displacement, cervicothoracic region: Secondary | ICD-10-CM | POA: Diagnosis not present

## 2016-05-07 DIAGNOSIS — M461 Sacroiliitis, not elsewhere classified: Secondary | ICD-10-CM | POA: Diagnosis not present

## 2016-05-07 DIAGNOSIS — M791 Myalgia: Secondary | ICD-10-CM | POA: Diagnosis not present

## 2016-05-07 DIAGNOSIS — M542 Cervicalgia: Secondary | ICD-10-CM | POA: Diagnosis not present

## 2016-05-10 DIAGNOSIS — M542 Cervicalgia: Secondary | ICD-10-CM | POA: Diagnosis not present

## 2016-05-10 DIAGNOSIS — M461 Sacroiliitis, not elsewhere classified: Secondary | ICD-10-CM | POA: Diagnosis not present

## 2016-05-10 DIAGNOSIS — M47812 Spondylosis without myelopathy or radiculopathy, cervical region: Secondary | ICD-10-CM | POA: Diagnosis not present

## 2016-05-10 DIAGNOSIS — M5023 Other cervical disc displacement, cervicothoracic region: Secondary | ICD-10-CM | POA: Diagnosis not present

## 2016-05-10 DIAGNOSIS — M791 Myalgia: Secondary | ICD-10-CM | POA: Diagnosis not present

## 2016-05-12 DIAGNOSIS — M791 Myalgia: Secondary | ICD-10-CM | POA: Diagnosis not present

## 2016-05-12 DIAGNOSIS — M5023 Other cervical disc displacement, cervicothoracic region: Secondary | ICD-10-CM | POA: Diagnosis not present

## 2016-05-12 DIAGNOSIS — M461 Sacroiliitis, not elsewhere classified: Secondary | ICD-10-CM | POA: Diagnosis not present

## 2016-05-12 DIAGNOSIS — M542 Cervicalgia: Secondary | ICD-10-CM | POA: Diagnosis not present

## 2016-05-12 DIAGNOSIS — M47812 Spondylosis without myelopathy or radiculopathy, cervical region: Secondary | ICD-10-CM | POA: Diagnosis not present

## 2016-05-20 DIAGNOSIS — M542 Cervicalgia: Secondary | ICD-10-CM | POA: Diagnosis not present

## 2016-05-20 DIAGNOSIS — M5023 Other cervical disc displacement, cervicothoracic region: Secondary | ICD-10-CM | POA: Diagnosis not present

## 2016-05-20 DIAGNOSIS — M47812 Spondylosis without myelopathy or radiculopathy, cervical region: Secondary | ICD-10-CM | POA: Diagnosis not present

## 2016-05-20 DIAGNOSIS — M791 Myalgia: Secondary | ICD-10-CM | POA: Diagnosis not present

## 2016-05-20 DIAGNOSIS — M461 Sacroiliitis, not elsewhere classified: Secondary | ICD-10-CM | POA: Diagnosis not present

## 2016-09-17 DIAGNOSIS — M546 Pain in thoracic spine: Secondary | ICD-10-CM | POA: Diagnosis not present

## 2016-09-17 DIAGNOSIS — Z789 Other specified health status: Secondary | ICD-10-CM | POA: Diagnosis not present

## 2016-09-17 DIAGNOSIS — J069 Acute upper respiratory infection, unspecified: Secondary | ICD-10-CM | POA: Diagnosis not present

## 2016-11-17 DIAGNOSIS — Z0000 Encounter for general adult medical examination without abnormal findings: Secondary | ICD-10-CM | POA: Diagnosis not present

## 2016-11-17 DIAGNOSIS — Z1389 Encounter for screening for other disorder: Secondary | ICD-10-CM | POA: Diagnosis not present

## 2016-11-18 DIAGNOSIS — E282 Polycystic ovarian syndrome: Secondary | ICD-10-CM | POA: Diagnosis not present

## 2016-11-18 DIAGNOSIS — Z713 Dietary counseling and surveillance: Secondary | ICD-10-CM | POA: Diagnosis not present

## 2016-11-18 DIAGNOSIS — Z7689 Persons encountering health services in other specified circumstances: Secondary | ICD-10-CM | POA: Diagnosis not present

## 2016-11-18 DIAGNOSIS — Z6841 Body Mass Index (BMI) 40.0 and over, adult: Secondary | ICD-10-CM | POA: Diagnosis not present

## 2016-11-23 ENCOUNTER — Telehealth: Payer: Self-pay | Admitting: Family Medicine

## 2016-11-23 NOTE — Telephone Encounter (Signed)
ROI fax to [facility] °

## 2016-12-07 NOTE — Telephone Encounter (Signed)
Rec'd from [facility] forwarded 18 pages to Dr. Danise EdgeBlyth Stacey A

## 2017-01-19 ENCOUNTER — Other Ambulatory Visit: Payer: Self-pay | Admitting: Internal Medicine

## 2017-01-19 DIAGNOSIS — E282 Polycystic ovarian syndrome: Secondary | ICD-10-CM | POA: Diagnosis not present

## 2017-01-19 DIAGNOSIS — R1013 Epigastric pain: Secondary | ICD-10-CM

## 2017-01-19 DIAGNOSIS — R21 Rash and other nonspecific skin eruption: Secondary | ICD-10-CM | POA: Diagnosis not present

## 2017-01-20 DIAGNOSIS — S99921A Unspecified injury of right foot, initial encounter: Secondary | ICD-10-CM | POA: Diagnosis not present

## 2017-01-20 DIAGNOSIS — M79671 Pain in right foot: Secondary | ICD-10-CM | POA: Diagnosis not present

## 2017-01-20 DIAGNOSIS — M5416 Radiculopathy, lumbar region: Secondary | ICD-10-CM | POA: Diagnosis not present

## 2017-01-20 DIAGNOSIS — G8929 Other chronic pain: Secondary | ICD-10-CM | POA: Diagnosis not present

## 2017-01-26 ENCOUNTER — Ambulatory Visit
Admission: RE | Admit: 2017-01-26 | Discharge: 2017-01-26 | Disposition: A | Discharge status: Home / Self Care | Payer: BLUE CROSS/BLUE SHIELD | Source: Ambulatory Visit | Attending: Internal Medicine | Admitting: Internal Medicine

## 2017-01-26 ENCOUNTER — Other Ambulatory Visit: Payer: Self-pay | Admitting: Internal Medicine

## 2017-01-26 DIAGNOSIS — R1013 Epigastric pain: Secondary | ICD-10-CM | POA: Diagnosis not present

## 2017-01-31 ENCOUNTER — Ambulatory Visit
Admission: RE | Admit: 2017-01-31 | Discharge: 2017-01-31 | Disposition: A | Discharge status: Home / Self Care | Payer: BLUE CROSS/BLUE SHIELD | Source: Ambulatory Visit | Attending: Internal Medicine | Admitting: Internal Medicine

## 2017-01-31 DIAGNOSIS — K8020 Calculus of gallbladder without cholecystitis without obstruction: Secondary | ICD-10-CM | POA: Diagnosis not present

## 2017-01-31 DIAGNOSIS — R1013 Epigastric pain: Secondary | ICD-10-CM

## 2017-02-17 DIAGNOSIS — Z7689 Persons encountering health services in other specified circumstances: Secondary | ICD-10-CM | POA: Diagnosis not present

## 2017-02-17 DIAGNOSIS — Z6841 Body Mass Index (BMI) 40.0 and over, adult: Secondary | ICD-10-CM | POA: Diagnosis not present

## 2017-02-17 DIAGNOSIS — Z01818 Encounter for other preprocedural examination: Secondary | ICD-10-CM | POA: Diagnosis not present

## 2017-02-23 DIAGNOSIS — Z6841 Body Mass Index (BMI) 40.0 and over, adult: Secondary | ICD-10-CM | POA: Diagnosis not present

## 2017-02-23 DIAGNOSIS — K760 Fatty (change of) liver, not elsewhere classified: Secondary | ICD-10-CM | POA: Diagnosis not present

## 2017-02-23 DIAGNOSIS — K8020 Calculus of gallbladder without cholecystitis without obstruction: Secondary | ICD-10-CM | POA: Diagnosis not present

## 2017-02-23 DIAGNOSIS — E282 Polycystic ovarian syndrome: Secondary | ICD-10-CM | POA: Diagnosis not present

## 2017-02-23 DIAGNOSIS — F17290 Nicotine dependence, other tobacco product, uncomplicated: Secondary | ICD-10-CM | POA: Diagnosis not present

## 2017-02-23 DIAGNOSIS — R160 Hepatomegaly, not elsewhere classified: Secondary | ICD-10-CM | POA: Diagnosis not present

## 2017-03-24 DIAGNOSIS — Z48815 Encounter for surgical aftercare following surgery on the digestive system: Secondary | ICD-10-CM | POA: Diagnosis not present

## 2017-03-24 DIAGNOSIS — Z9049 Acquired absence of other specified parts of digestive tract: Secondary | ICD-10-CM | POA: Diagnosis not present

## 2017-04-29 ENCOUNTER — Other Ambulatory Visit: Payer: Self-pay

## 2017-04-29 ENCOUNTER — Encounter (HOSPITAL_BASED_OUTPATIENT_CLINIC_OR_DEPARTMENT_OTHER): Payer: Self-pay | Admitting: Adult Health

## 2017-04-29 ENCOUNTER — Emergency Department (HOSPITAL_BASED_OUTPATIENT_CLINIC_OR_DEPARTMENT_OTHER)
Admission: EM | Admit: 2017-04-29 | Discharge: 2017-04-29 | Disposition: A | Discharge status: Home / Self Care | Payer: BLUE CROSS/BLUE SHIELD | Attending: Emergency Medicine | Admitting: Emergency Medicine

## 2017-04-29 DIAGNOSIS — Z79899 Other long term (current) drug therapy: Secondary | ICD-10-CM | POA: Insufficient documentation

## 2017-04-29 DIAGNOSIS — Y999 Unspecified external cause status: Secondary | ICD-10-CM | POA: Diagnosis not present

## 2017-04-29 DIAGNOSIS — Z87891 Personal history of nicotine dependence: Secondary | ICD-10-CM | POA: Diagnosis not present

## 2017-04-29 DIAGNOSIS — Y9389 Activity, other specified: Secondary | ICD-10-CM | POA: Insufficient documentation

## 2017-04-29 DIAGNOSIS — Y9289 Other specified places as the place of occurrence of the external cause: Secondary | ICD-10-CM | POA: Insufficient documentation

## 2017-04-29 DIAGNOSIS — W228XXA Striking against or struck by other objects, initial encounter: Secondary | ICD-10-CM | POA: Diagnosis not present

## 2017-04-29 DIAGNOSIS — M79675 Pain in left toe(s): Secondary | ICD-10-CM | POA: Insufficient documentation

## 2017-04-29 NOTE — Discharge Instructions (Addendum)
For your pain you can take tylenol and ibuprofen as needed. We placed your foot in a shoe for support.

## 2017-04-29 NOTE — ED Provider Notes (Signed)
[facility] EMERGENCY DEPARTMENT Provider Note   CSN: [id number] Arrival date & time: 04/29/17  1323     History   Chief Complaint Chief Complaint  Patient presents with  . Toe Pain    HPI Megan Morton is a 36 y.o. female.  HPI 36 yo female with PMH of obesity, PCOS, allergic rhinitis presents with left foot second toe pain after hitting it in the inside of a limo. She reports of continued pain at the tip of the toe with walking. No numbness or tingling.    Past Medical History:  Diagnosis Date  . Allergic state 11/12/2011   allergic rhinitis  . History of chicken pox   . Obesity 11/12/2011  . Polycystic ovarian disease 11/23/2011    Patient Active Problem List   Diagnosis Date Noted  . Preventative health care 06/24/2014  . Ingrown toenail 06/24/2014  . Low back pain radiating to right leg 12/23/2013  . Bleeding hemorrhoids 02/02/2012  . Polycystic ovarian disease 11/23/2011  . Obesity 11/12/2011  . Irregular menses 11/12/2011  . Allergic state 11/12/2011    Past Surgical History:  Procedure Laterality Date  . WISDOM TOOTH EXTRACTION  2014    OB History    No data available       Home Medications    Prior to Admission medications   Medication Sig Start Date End Date Taking? Authorizing Provider  fluticasone (FLONASE) 50 MCG/ACT nasal spray Place 2 sprays into both nostrils daily. Patient not taking: Reported on 05/20/2015 10/16/14   Tysinger, Kermit Baloavid S, PA-C  RABEprazole (ACIPHEX) 20 MG tablet Take 20 mg by mouth daily. Reported on 05/20/2015    [provider]    Family History Family History  Problem Relation Age of Onset  . Hypertension Mother   . Stroke Father   . Hypertension Father   . GER disease Sister     Social History Social History   Tobacco Use  . Smoking status: Former Smoker    Last attempt to quit: 03/20/2015    Years since quitting: 2.1  . Smokeless tobacco: Never Used  . Tobacco comment: hookah  Substance  Use Topics  . Alcohol use: No    Alcohol/week: 0.0 oz  . Drug use: No     Allergies   Patient has no known allergies.   Review of Systems Review of Systems  Musculoskeletal:       Left foot second toe pain      Physical Exam Updated Vital Signs BP 138/84   Pulse 92   Temp 98.1 F (36.7 C) (Oral)   Resp 18   Ht 5\' 2"  (1.575 m)   Wt 115.7 kg (255 lb)   LMP 04/01/2017 (Approximate)   SpO2 97%   BMI 46.64 kg/m   Physical Exam  Constitutional: She appears well-developed and well-nourished. No distress.  Musculoskeletal:  Left Foot: echymosis noted of the second toe distal to the DIP but no subungual hematoma. Tenderness to palpation of the DIP joint of the second toe. Otherwise no tenderness. Dorsalis pedis palpated. Normal capillary refill. Normal sensation to light touch.   Skin: Skin is warm and dry. Capillary refill takes less than 2 seconds.  Psychiatric: She has a normal mood and affect. Her behavior is normal. Judgment and thought content normal.     ED Treatments / Results  Labs (all labs ordered are listed, but only abnormal results are displayed) Labs Reviewed - No data to display  EKG  EKG Interpretation None  Radiology No results found.  Procedures Procedures (including critical care time)  Medications Ordered in ED Medications - No data to display   Initial Impression / Assessment and Plan / ED Course  I have reviewed the triage vital signs and the nursing notes.  Pertinent labs & imaging results that were available during my care of the patient were reviewed by me and considered in my medical decision making (see chart for details).  36 yo female with PMH of obesity, PCOS, allergic rhinitis presents with left foot second toe pain after hitting it in the inside of a limo. There is some bruising, but no gross deformity of the toe. No imaging indicated. Recommended Tylenol and/or Ibuprofen PRN for pain. Placed in post op shoe for  comfort. Follow up with PCP in 1-2 weeks. Return precautions discussed.   Final Clinical Impressions(s) / ED Diagnoses   Final diagnoses:  Pain of toe of left foot    ED Discharge Orders    None       Palma HolterGunadasa, Kanishka G, MD 04/29/17 1537    Nira Connardama, Pedro Eduardo, MD 04/30/17 [phone]

## 2017-04-29 NOTE — ED Triage Notes (Signed)
Presents with left second toe injury form hitting it on the side of a limo last night while getting into the limo. Endorses pain6/10. Toe is bruised. Able to wiggle digits.

## 2017-07-27 DIAGNOSIS — Z6841 Body Mass Index (BMI) 40.0 and over, adult: Secondary | ICD-10-CM | POA: Diagnosis not present

## 2017-07-27 DIAGNOSIS — Z3009 Encounter for other general counseling and advice on contraception: Secondary | ICD-10-CM | POA: Diagnosis not present

## 2017-08-30 DIAGNOSIS — R6889 Other general symptoms and signs: Secondary | ICD-10-CM | POA: Diagnosis not present

## 2017-08-30 DIAGNOSIS — Z7409 Other reduced mobility: Secondary | ICD-10-CM | POA: Diagnosis not present

## 2017-08-30 DIAGNOSIS — M25569 Pain in unspecified knee: Secondary | ICD-10-CM | POA: Diagnosis not present

## 2017-11-21 DIAGNOSIS — M940 Chondrocostal junction syndrome [Tietze]: Secondary | ICD-10-CM | POA: Diagnosis not present

## 2017-11-21 DIAGNOSIS — N644 Mastodynia: Secondary | ICD-10-CM | POA: Diagnosis not present

## 2017-11-22 DIAGNOSIS — E559 Vitamin D deficiency, unspecified: Secondary | ICD-10-CM | POA: Diagnosis not present

## 2017-11-22 DIAGNOSIS — E282 Polycystic ovarian syndrome: Secondary | ICD-10-CM | POA: Diagnosis not present

## 2017-11-22 DIAGNOSIS — Z0000 Encounter for general adult medical examination without abnormal findings: Secondary | ICD-10-CM | POA: Diagnosis not present

## 2017-11-22 DIAGNOSIS — Z1389 Encounter for screening for other disorder: Secondary | ICD-10-CM | POA: Diagnosis not present

## 2017-12-01 DIAGNOSIS — N644 Mastodynia: Secondary | ICD-10-CM | POA: Diagnosis not present

## 2017-12-08 IMAGING — US US ABDOMEN LIMITED
1 series · 14 of 25 positions shown · non-contrast
Comparison: None.

CLINICAL DATA: Right upper quadrant pain for several months

EXAM:
ULTRASOUND ABDOMEN LIMITED RIGHT UPPER QUADRANT

[Series 1: us abdomen limited · 0.25mm/px · 14 of 38 slices shown]
[im 1/38]
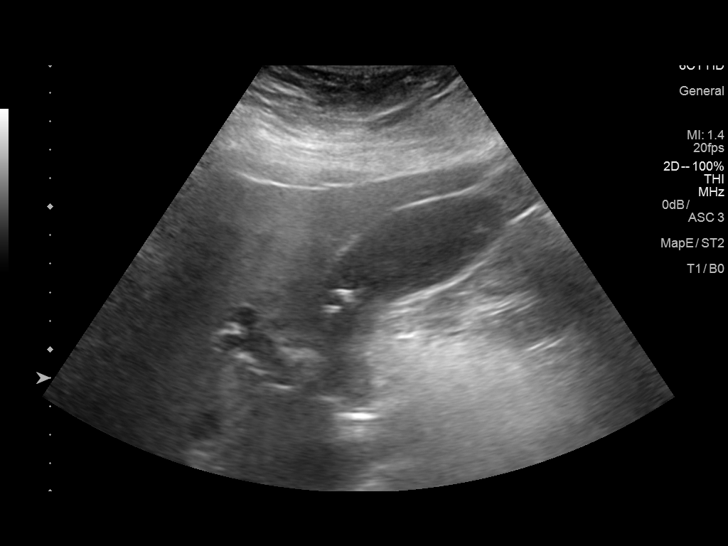
[im 4/38]
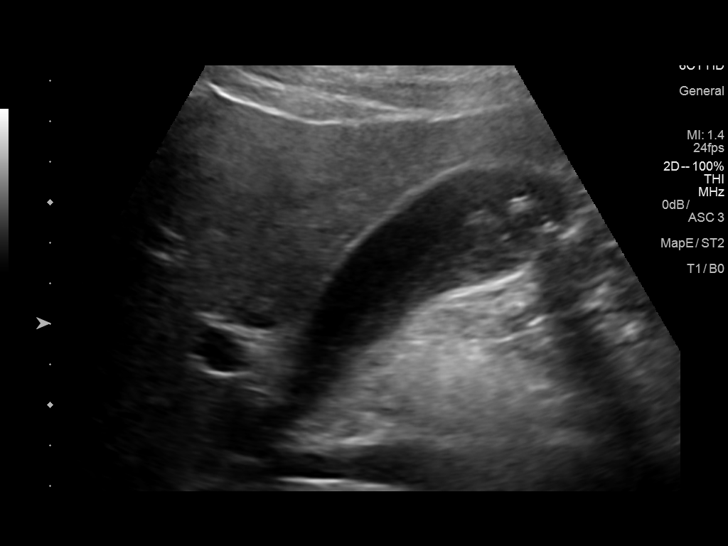
[im 7/38]
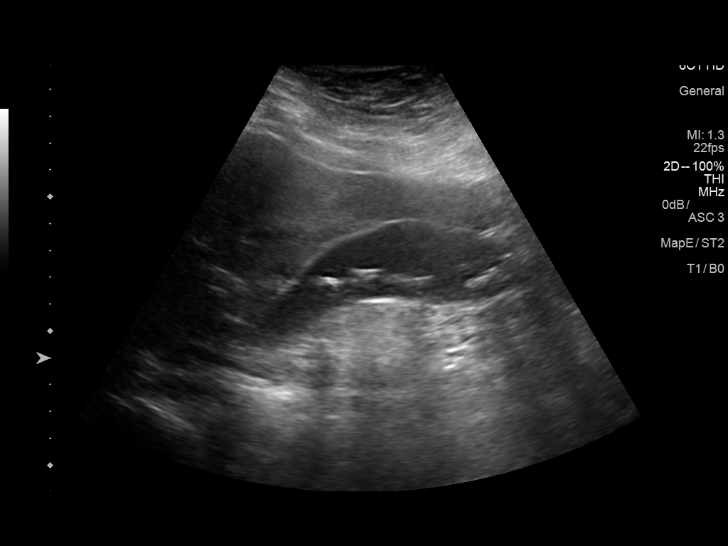
[im 10/38]
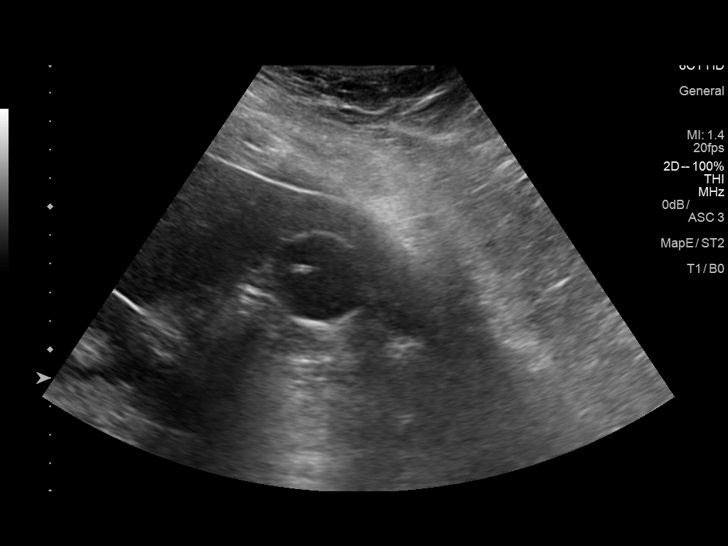
[im 13/38]
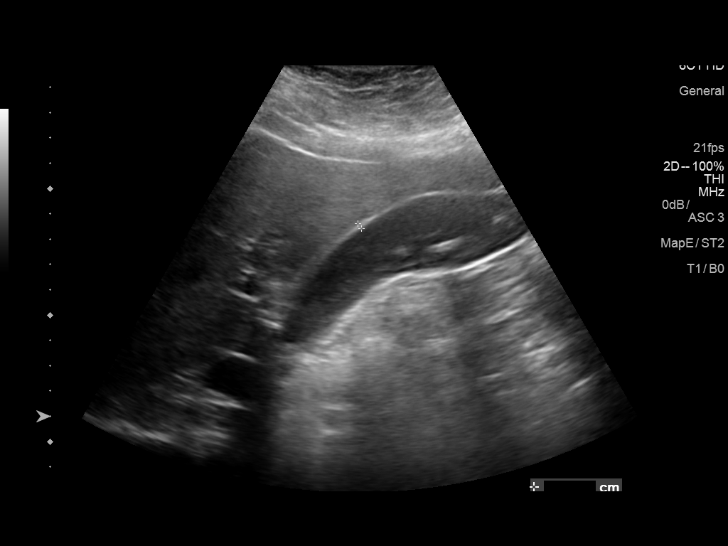
[im 14/38]
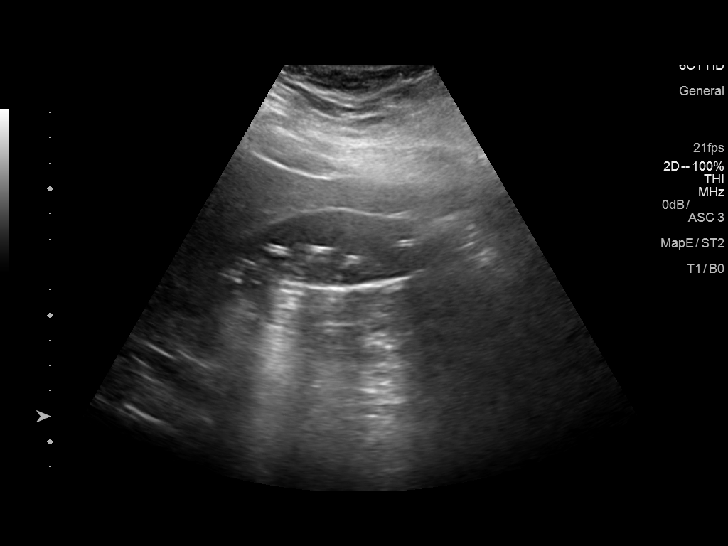
[im 17/38]
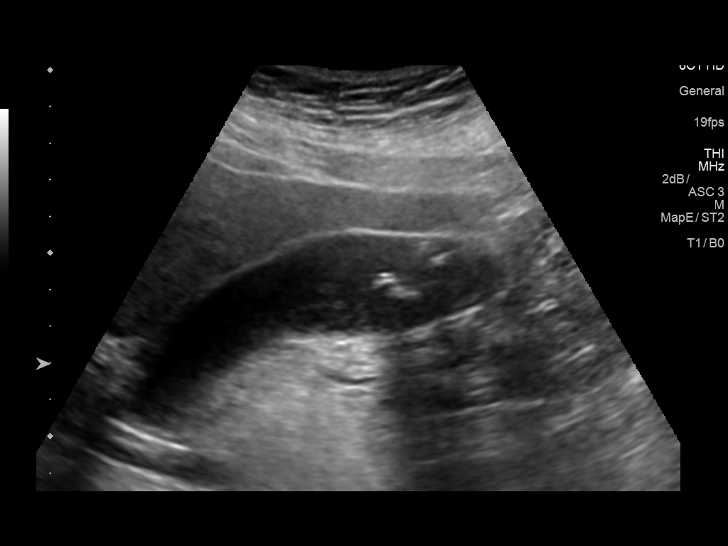
[im 21/38]
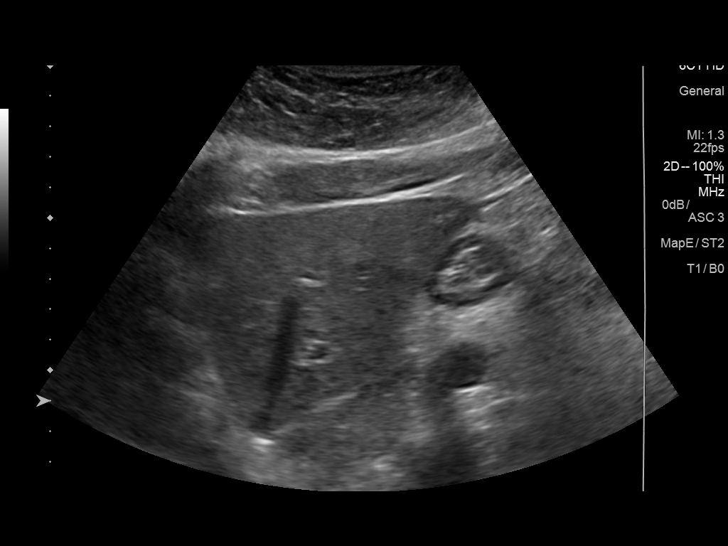
[im 24/38]
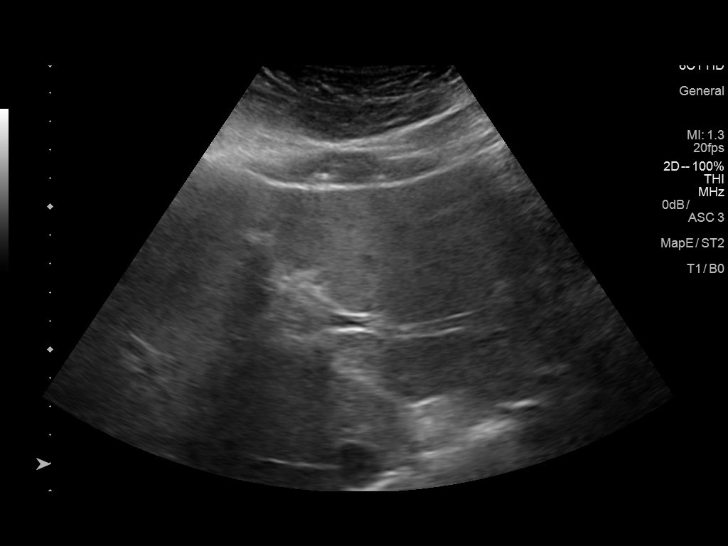
[im 25/38]
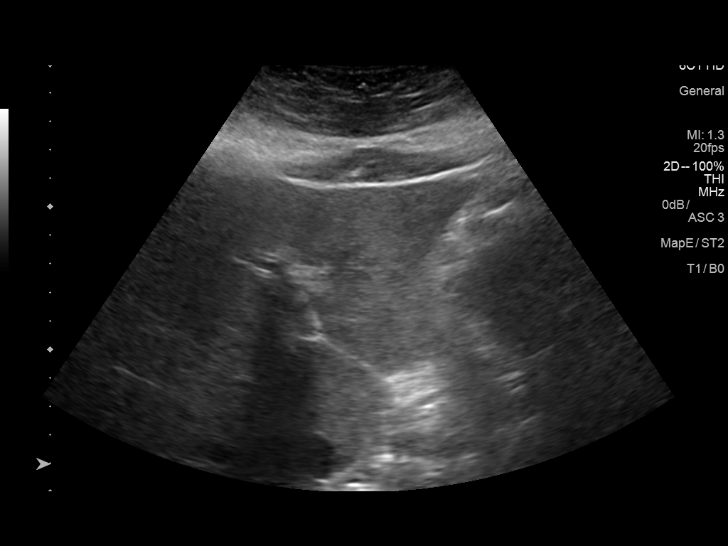
[im 28/38]
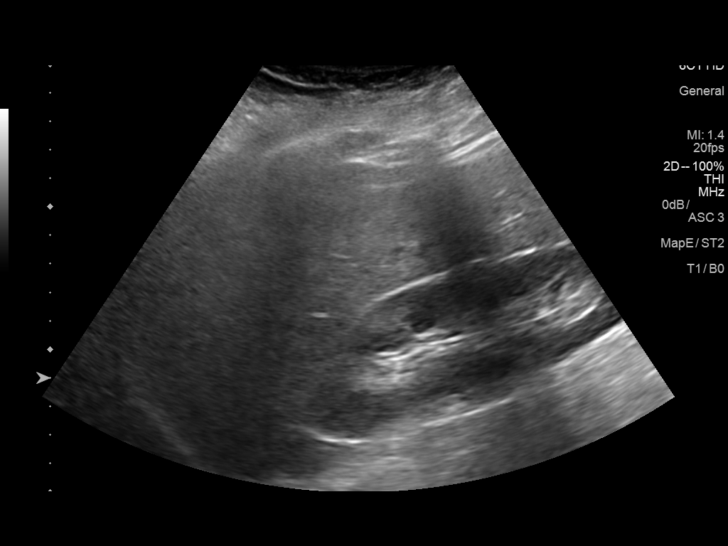
[im 31/38]
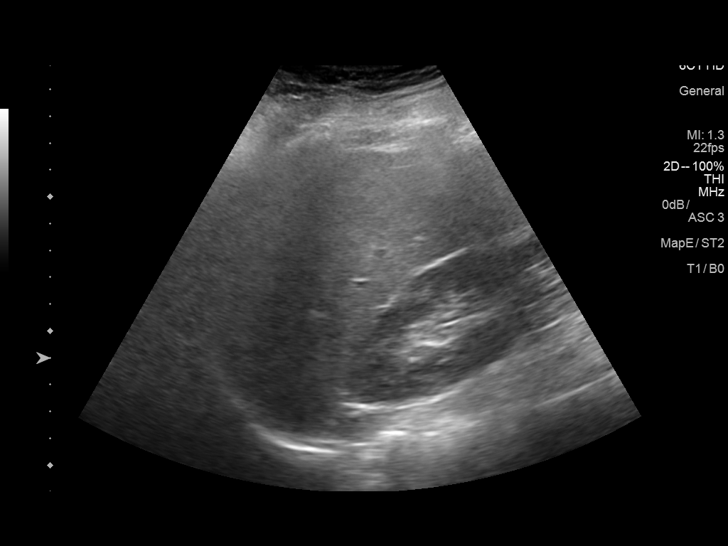
[im 34/38]
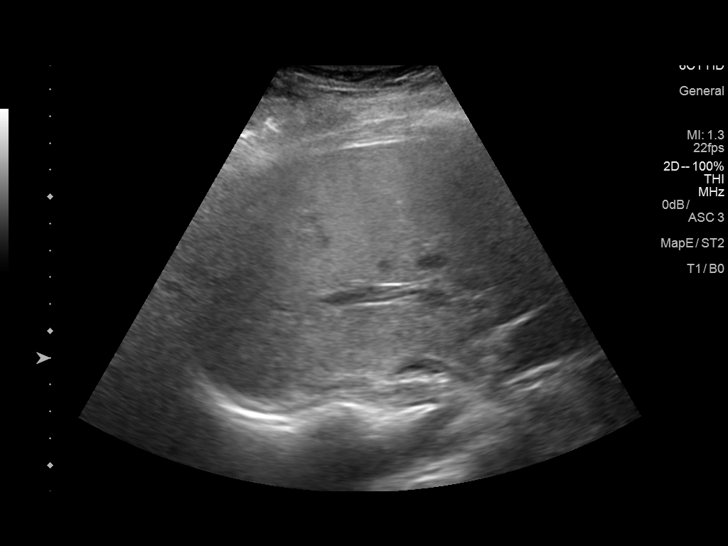
[im 38/38]
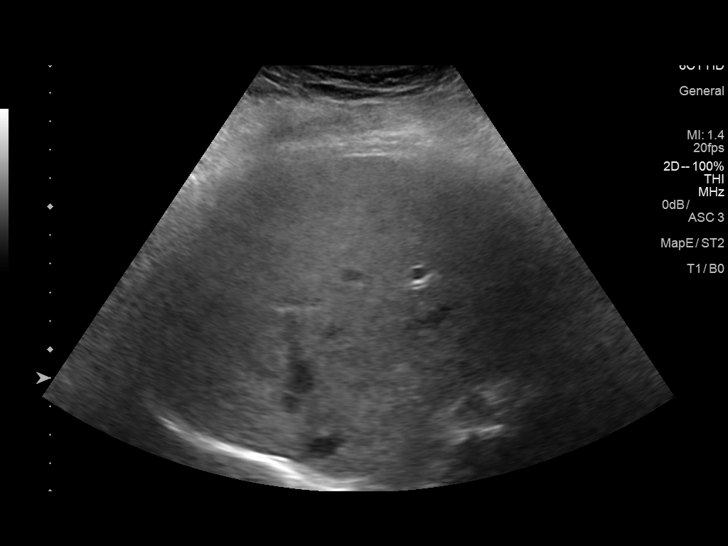

[14 of 25 positions shown; findings below may reference images not displayed]

FINDINGS: Gallbladder:

Gallbladder is well distended with multiple gallstones. No wall
thickening or pericholecystic fluid is noted. Positive sonographic
Murphy sign is elicited.

Common bile duct:

Diameter: 4.6 mm

Liver:

No focal lesion identified. Within normal limits in parenchymal
echogenicity. Portal vein is patent on color Doppler imaging with
normal direction of blood flow towards the liver.
IMPRESSION: Cholelithiasis with positive sonographic Murphy sign.

No other focal abnormality is noted.

## 2017-12-15 ENCOUNTER — Other Ambulatory Visit: Payer: Self-pay | Admitting: Internal Medicine

## 2017-12-15 DIAGNOSIS — K760 Fatty (change of) liver, not elsewhere classified: Secondary | ICD-10-CM

## 2017-12-15 DIAGNOSIS — E559 Vitamin D deficiency, unspecified: Secondary | ICD-10-CM | POA: Diagnosis not present

## 2017-12-15 DIAGNOSIS — E7800 Pure hypercholesterolemia, unspecified: Secondary | ICD-10-CM | POA: Diagnosis not present

## 2017-12-22 ENCOUNTER — Other Ambulatory Visit: Payer: BLUE CROSS/BLUE SHIELD

## 2018-01-12 DIAGNOSIS — T2029XA Burn of second degree of multiple sites of head, face, and neck, initial encounter: Secondary | ICD-10-CM | POA: Diagnosis not present

## 2018-01-12 DIAGNOSIS — Z23 Encounter for immunization: Secondary | ICD-10-CM | POA: Diagnosis not present

## 2018-01-12 DIAGNOSIS — L551 Sunburn of second degree: Secondary | ICD-10-CM | POA: Diagnosis not present

## 2018-01-16 DIAGNOSIS — T300 Burn of unspecified body region, unspecified degree: Secondary | ICD-10-CM | POA: Diagnosis not present

## 2018-01-16 DIAGNOSIS — L551 Sunburn of second degree: Secondary | ICD-10-CM | POA: Diagnosis not present

## 2018-03-28 DIAGNOSIS — Z9884 Bariatric surgery status: Secondary | ICD-10-CM | POA: Diagnosis not present

## 2018-03-28 DIAGNOSIS — R030 Elevated blood-pressure reading, without diagnosis of hypertension: Secondary | ICD-10-CM | POA: Diagnosis not present

## 2018-03-28 DIAGNOSIS — H6990 Unspecified Eustachian tube disorder, unspecified ear: Secondary | ICD-10-CM | POA: Diagnosis not present

## 2018-05-09 ENCOUNTER — Encounter: Payer: Self-pay | Admitting: Skilled Nursing Facility1

## 2018-05-09 ENCOUNTER — Encounter: Payer: BLUE CROSS/BLUE SHIELD | Attending: Internal Medicine | Admitting: Skilled Nursing Facility1

## 2018-05-09 DIAGNOSIS — I10 Essential (primary) hypertension: Secondary | ICD-10-CM | POA: Diagnosis not present

## 2018-05-09 DIAGNOSIS — E669 Obesity, unspecified: Secondary | ICD-10-CM

## 2018-05-09 DIAGNOSIS — Z713 Dietary counseling and surveillance: Secondary | ICD-10-CM | POA: Insufficient documentation

## 2018-05-09 DIAGNOSIS — Z9884 Bariatric surgery status: Secondary | ICD-10-CM | POA: Insufficient documentation

## 2018-05-09 NOTE — Patient Instructions (Addendum)
-  Take the appropriate multivitamin   -Take your multivitamin at least 2 hours from your calcium  -Take 3 calcium every day at least 2 hours a part  -Have food on your stomach when you take your multivitamin; take your multivitamin later in the day  -Aim to have 60 grams of protein every day: 3 ounce per meal and about 1-2 ounces per snack  -Eat every 3 hours; eat at least 1 snack   -Do not drink coffee  -Do not add sugar to anything   -Try to attend support group  -Tell your doctor today that if you cannot meet 40 ounces of fluid by tomorrow he will need to get you a referral to the hydration clinic for Thursday

## 2018-05-09 NOTE — Progress Notes (Signed)
Follow-up visit: Post-Operative Sleeve Gastrectomy Surgery  Primary concerns today: Post-operative Bariatric Surgery Nutrition Management.  Pt arrives for follow up having the sleeve gastrectomy in [place], October 10th, 2019.   Pt states she threw up one time from drinking too much (middle [place] drink with sugar and starch). Pt states she has had high blood pressure since having surgery resulting in headaches. Pt states she is seeing her physician today to discuss blood pressure medication. Pt states she has been struggling with hair loss since before surgery. Pt states sometimes rice causes pain. 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  Pt was on her phone throughout the appt.   Pt was advised to choose oil more often than butter: pt did not trust in this information and states I do not know about that I am going to have to research that: Dietitian advised she visit the American Heart Association website.   24-hr recall: B (AM): 1-2 scrambled eggs with green pepper onion tomato  Snk (AM):  L (PM): soup: rice and chicken soup Or skipped Snk (PM):  chips D (PM): half burger patty with lettuce and toamto Snk (PM):   Fluid intake: white mocha coffee (12 ounces), water:  Estimated total protein intake: 40+  Medications: None Supplementation: multi, vitamin, vitamin C  Using straws: no Drinking while eating: no Having you been chewing well: sometimes  Chewing/swallowing difficulties: no Changes in vision: no Changes to mood/headaches: no Hair loss/Cahnges to skin/Changes to nails: no Any difficulty focusing or concentrating: no Sweating: no Dizziness/Lightheaded:  Palpitations: no  Carbonated beverages: no N/V/D/C/GAS: no Abdominal Pain: no Dumping syndrome: no  Recent physical activity:  ADL's  Progress Towards Goal(s):  In progress.  Handouts given during visit include:  Protein + non starchy veggies    Intervention:  Nutrition counseling. Due to the bodies need for  essential vitamins, minerals, and fats the pt was educated on the need to consume a certain amount of calories as well as certain nutrients daily. Pt was educated on the need for daily physical activity and to reach a goal of at least 150 minutes of moderate to vigorous physical activity as directed by their physician due to such benefits as increased musculature and improved lab values.  Goals: -Take the appropriate multivitamin  -Take your multivitamin at least 2 hours from your calcium -Take 3 calcium every day at least 2 hours a part -Have food on your stomach when you take your multivitamin; take your multivitamin later in the day -Aim to have 60 grams of protein every day: 3 ounce per meal and about 1-2 ounces per snack -Eat every 3 hours; eat at least 1 snack  -Do not drink coffee -Do not add sugar to anything  -Try to attend support group -Tell your doctor today that if you cannot meet 40 ounces of fluid by tomorrow he will need to get you a referral to the hydration clinic for Thursday  -Wait for your intense exercise for when energy sources added back in and headaches resolved but less intense walking is okay  Teaching Method Utilized:  Visual Auditory Hands on  Barriers to learning/adherence to lifestyle change: distrust in provider   Demonstrated degree of understanding via:  Teach Back   Monitoring/Evaluation:  Dietary intake, exercise, and body weight. Follow up in 1 month

## 2018-05-11 ENCOUNTER — Telehealth: Payer: Self-pay | Admitting: Skilled Nursing Facility1

## 2018-05-11 NOTE — Telephone Encounter (Signed)
Pt states she has started the blood pressure medication.   Pt states yesterday she drank 60 oz and today so far 40 oz + 16 ounces of juice stating her headaches have gotten better with mor fluid.   Pt states she got he chewable multivitamin.

## 2018-06-07 ENCOUNTER — Ambulatory Visit: Payer: BLUE CROSS/BLUE SHIELD | Admitting: Psychology

## 2018-06-13 ENCOUNTER — Ambulatory Visit: Payer: BLUE CROSS/BLUE SHIELD | Admitting: Skilled Nursing Facility1

## 2018-06-28 ENCOUNTER — Emergency Department (HOSPITAL_BASED_OUTPATIENT_CLINIC_OR_DEPARTMENT_OTHER)
Admission: EM | Admit: 2018-06-28 | Discharge: 2018-06-28 | Disposition: A | Discharge status: Home / Self Care | Payer: BLUE CROSS/BLUE SHIELD | Attending: Emergency Medicine | Admitting: Emergency Medicine

## 2018-06-28 ENCOUNTER — Other Ambulatory Visit: Payer: Self-pay

## 2018-06-28 ENCOUNTER — Encounter (HOSPITAL_BASED_OUTPATIENT_CLINIC_OR_DEPARTMENT_OTHER): Payer: Self-pay

## 2018-06-28 DIAGNOSIS — I10 Essential (primary) hypertension: Secondary | ICD-10-CM | POA: Diagnosis not present

## 2018-06-28 DIAGNOSIS — Z5321 Procedure and treatment not carried out due to patient leaving prior to being seen by health care provider: Secondary | ICD-10-CM | POA: Diagnosis not present

## 2018-06-28 HISTORY — DX: Essential (primary) hypertension: I10

## 2018-06-28 NOTE — ED Triage Notes (Signed)
C/o elevated BP x 2 days-states she was started on BP meds x 1 month ago after gastric sleeve surgery-NAD-steady gait

## 2018-07-05 DIAGNOSIS — H6993 Unspecified Eustachian tube disorder, bilateral: Secondary | ICD-10-CM | POA: Diagnosis not present

## 2018-07-05 DIAGNOSIS — I10 Essential (primary) hypertension: Secondary | ICD-10-CM | POA: Diagnosis not present

## 2018-07-12 DIAGNOSIS — Z713 Dietary counseling and surveillance: Secondary | ICD-10-CM | POA: Diagnosis not present

## 2018-11-08 DIAGNOSIS — Z713 Dietary counseling and surveillance: Secondary | ICD-10-CM | POA: Diagnosis not present

## 2018-12-07 ENCOUNTER — Other Ambulatory Visit: Payer: Self-pay | Admitting: Internal Medicine

## 2018-12-07 DIAGNOSIS — Z131 Encounter for screening for diabetes mellitus: Secondary | ICD-10-CM | POA: Diagnosis not present

## 2018-12-07 DIAGNOSIS — Z0000 Encounter for general adult medical examination without abnormal findings: Secondary | ICD-10-CM | POA: Diagnosis not present

## 2018-12-07 DIAGNOSIS — I10 Essential (primary) hypertension: Secondary | ICD-10-CM | POA: Diagnosis not present

## 2018-12-07 DIAGNOSIS — E559 Vitamin D deficiency, unspecified: Secondary | ICD-10-CM | POA: Diagnosis not present

## 2019-06-13 DIAGNOSIS — I10 Essential (primary) hypertension: Secondary | ICD-10-CM | POA: Diagnosis not present

## 2019-06-13 DIAGNOSIS — E559 Vitamin D deficiency, unspecified: Secondary | ICD-10-CM | POA: Diagnosis not present

## 2019-06-13 DIAGNOSIS — E282 Polycystic ovarian syndrome: Secondary | ICD-10-CM | POA: Diagnosis not present

## 2019-09-06 ENCOUNTER — Ambulatory Visit (INDEPENDENT_AMBULATORY_CARE_PROVIDER_SITE_OTHER): Payer: BC Managed Care – PPO | Admitting: Psychology

## 2019-09-06 DIAGNOSIS — F331 Major depressive disorder, recurrent, moderate: Secondary | ICD-10-CM

## 2019-09-06 DIAGNOSIS — R4581 Low self-esteem: Secondary | ICD-10-CM

## 2019-10-16 ENCOUNTER — Ambulatory Visit (INDEPENDENT_AMBULATORY_CARE_PROVIDER_SITE_OTHER): Payer: BC Managed Care – PPO | Admitting: Psychology

## 2019-10-16 DIAGNOSIS — F331 Major depressive disorder, recurrent, moderate: Secondary | ICD-10-CM | POA: Diagnosis not present

## 2019-10-18 ENCOUNTER — Ambulatory Visit: Payer: BC Managed Care – PPO | Admitting: Psychology

## 2019-12-05 ENCOUNTER — Ambulatory Visit (INDEPENDENT_AMBULATORY_CARE_PROVIDER_SITE_OTHER): Payer: BC Managed Care – PPO | Admitting: Psychology

## 2019-12-05 DIAGNOSIS — F331 Major depressive disorder, recurrent, moderate: Secondary | ICD-10-CM | POA: Diagnosis not present

## 2019-12-12 ENCOUNTER — Ambulatory Visit: Payer: BC Managed Care – PPO | Admitting: Psychology

## 2019-12-18 DIAGNOSIS — E7800 Pure hypercholesterolemia, unspecified: Secondary | ICD-10-CM | POA: Diagnosis not present

## 2019-12-18 DIAGNOSIS — Z131 Encounter for screening for diabetes mellitus: Secondary | ICD-10-CM | POA: Diagnosis not present

## 2019-12-18 DIAGNOSIS — K760 Fatty (change of) liver, not elsewhere classified: Secondary | ICD-10-CM | POA: Diagnosis not present

## 2019-12-18 DIAGNOSIS — E559 Vitamin D deficiency, unspecified: Secondary | ICD-10-CM | POA: Diagnosis not present

## 2019-12-18 DIAGNOSIS — Z0000 Encounter for general adult medical examination without abnormal findings: Secondary | ICD-10-CM | POA: Diagnosis not present

## 2019-12-19 ENCOUNTER — Ambulatory Visit: Payer: BC Managed Care – PPO | Admitting: Psychology

## 2019-12-24 DIAGNOSIS — Z0000 Encounter for general adult medical examination without abnormal findings: Secondary | ICD-10-CM | POA: Diagnosis not present

## 2021-01-03 ENCOUNTER — Encounter (HOSPITAL_BASED_OUTPATIENT_CLINIC_OR_DEPARTMENT_OTHER): Payer: Self-pay | Admitting: Emergency Medicine

## 2021-01-03 ENCOUNTER — Emergency Department (HOSPITAL_BASED_OUTPATIENT_CLINIC_OR_DEPARTMENT_OTHER)
Admission: EM | Admit: 2021-01-03 | Discharge: 2021-01-03 | Disposition: A | Discharge status: Home / Self Care | Payer: 59 | Attending: Emergency Medicine | Admitting: Emergency Medicine

## 2021-01-03 ENCOUNTER — Other Ambulatory Visit: Payer: Self-pay

## 2021-01-03 DIAGNOSIS — Z87891 Personal history of nicotine dependence: Secondary | ICD-10-CM | POA: Diagnosis not present

## 2021-01-03 DIAGNOSIS — I10 Essential (primary) hypertension: Secondary | ICD-10-CM | POA: Insufficient documentation

## 2021-01-03 DIAGNOSIS — M436 Torticollis: Secondary | ICD-10-CM | POA: Insufficient documentation

## 2021-01-03 DIAGNOSIS — X500XXA Overexertion from strenuous movement or load, initial encounter: Secondary | ICD-10-CM | POA: Diagnosis not present

## 2021-01-03 DIAGNOSIS — M542 Cervicalgia: Secondary | ICD-10-CM | POA: Diagnosis present

## 2021-01-03 MED ORDER — KETOROLAC TROMETHAMINE 60 MG/2ML IM SOLN
60.0000 mg | Freq: Once | INTRAMUSCULAR | Status: AC
  Administered 2021-01-03: 60 mg via INTRAMUSCULAR
  Filled 2021-01-03: qty 2

## 2021-01-03 NOTE — ED Provider Notes (Signed)
[facility] EMERGENCY DEPARTMENT Provider Note   CSN: [id number] Arrival date & time: 01/03/21  0220     History Chief Complaint  Patient presents with   Neck Pain    Megan Morton is a 40 y.o. female.  The history is provided by the patient.  Neck Pain Megan Morton is a 40 y.o. female who presents to the Emergency Department complaining of neck pain. She presents the emergency department complaining of pain to the left side of her neck that started four days ago. She does state that she did have some heavy lifting. She went orthopedics was diagnosed with a muscle spasm as well as an ear infection and she was started on doxycycline, prednisone and Flexeril. She states that she has not started the prednisone yet. She does not feel any better with these medications. She did receive a one-time dose of Toradol and Kenalog at orthopedics and did have partial improvement in her symptoms at that time. She has mild associated left-sided sore throat. No difficulty swallowing or difficulty breathing. No fevers, nausea, vomiting, diarrhea, numbness, weakness. Symptoms overall are mild to moderate nature.     Past Medical History:  Diagnosis Date   Allergic state 11/12/2011   allergic rhinitis   History of chicken pox    Hypertension    Obesity 11/12/2011   Polycystic ovarian disease 11/23/2011    Patient Active Problem List   Diagnosis Date Noted   Preventative health care 06/24/2014   Ingrown toenail 06/24/2014   Low back pain radiating to right leg 12/23/2013   Bleeding hemorrhoids 02/02/2012   Polycystic ovarian disease 11/23/2011   Obesity 11/12/2011   Irregular menses 11/12/2011   Allergic state 11/12/2011    Past Surgical History:  Procedure Laterality Date   CHOLECYSTECTOMY     LAPAROSCOPIC GASTRIC SLEEVE RESECTION     sleeve gastrectomy     WISDOM TOOTH EXTRACTION  2014     OB History   No obstetric history on file.     Family History  Problem Relation Age of  Onset   Hypertension Mother    Stroke Father    Hypertension Father    GER disease Sister     Social History   Tobacco Use   Smoking status: Former    Types: Cigarettes    Quit date: 03/20/2015    Years since quitting: 5.7   Smokeless tobacco: Never   Tobacco comments:    hookah  Vaping Use   Vaping Use: Never used  Substance Use Topics   Alcohol use: No    Alcohol/week: 0.0 standard drinks   Drug use: No    Home Medications Prior to Admission medications   Medication Sig Start Date End Date Taking? Authorizing Provider  cyclobenzaprine (FLEXERIL) 5 MG tablet Take 5 mg by mouth at bedtime as needed for muscle spasms.   Yes [provider]  doxycycline (DORYX) 100 MG EC tablet Take 100 mg by mouth 2 (two) times daily.   Yes [provider]  predniSONE (STERAPRED UNI-PAK 21 TAB) 10 MG (21) TBPK tablet Take 10 mg by mouth taper from 4 doses each day to 1 dose and stop.   Yes [provider]  fluticasone (FLONASE) 50 MCG/ACT nasal spray Place 2 sprays into both nostrils daily. Patient not taking: Reported on 05/20/2015 10/16/14   Tysinger, Kermit Balo, PA-C  RABEprazole (ACIPHEX) 20 MG tablet Take 20 mg by mouth daily. Reported on 05/20/2015    [provider]  Allergies    Ampicillin  Review of Systems   Review of Systems  Musculoskeletal:  Positive for neck pain.  All other systems reviewed and are negative.  Physical Exam Updated Vital Signs BP 137/84 (BP Location: Right Arm)   Pulse 66   Temp 98.2 F (36.8 C) (Oral)   Resp 18   Ht 5\' 2"  (1.575 m)   Wt 107 kg   LMP 12/29/2020 (Exact Date)   SpO2 100%   BMI 43.16 kg/m   Physical Exam Vitals and nursing note reviewed.  Constitutional:      Appearance: She is well-developed.  HENT:     Head: Normocephalic and atraumatic.     Right Ear: Tympanic membrane normal.     Left Ear: Tympanic membrane normal.     Mouth/Throat:     Mouth: Mucous membranes are moist.     Pharynx:  No oropharyngeal exudate or posterior oropharyngeal erythema.  Neck:     Comments: Tenderness to palpation over the left lateral posterior neck. No midline cervical spine tenderness to palpation. Cardiovascular:     Rate and Rhythm: Normal rate and regular rhythm.     Heart sounds: No murmur heard. Pulmonary:     Effort: Pulmonary effort is normal. No respiratory distress.     Breath sounds: Normal breath sounds.  Abdominal:     Palpations: Abdomen is soft.     Tenderness: There is no abdominal tenderness. There is no guarding or rebound.  Musculoskeletal:        General: No tenderness.  Lymphadenopathy:     Cervical: No cervical adenopathy.  Skin:    General: Skin is warm and dry.  Neurological:     Mental Status: She is alert and oriented to person, place, and time.     Comments: Five out of five strength in all four extremities.  Psychiatric:        Behavior: Behavior normal.    ED Results / Procedures / Treatments   Labs (all labs ordered are listed, but only abnormal results are displayed) Labs Reviewed - No data to display  EKG None  Radiology No results found.  Procedures Procedures   Medications Ordered in ED Medications  ketorolac (TORADOL) injection 60 mg (has no administration in time range)    ED Course  I have reviewed the triage vital signs and the nursing notes.  Pertinent labs & imaging results that were available during my care of the patient were reviewed by me and considered in my medical decision making (see chart for details).    MDM Rules/Calculators/A&P                          patient here for evaluation of left-sided neck pain. Examination is consistent with torticollis. She is neurologically intact on examination. Presentation is not consistent with RPA, PTA, meningitis, dissection. Discussed with patient home care for torticollis with outpatient follow-up and return precautions.  Final Clinical Impression(s) / ED Diagnoses Final  diagnoses:  Torticollis, acute    Rx / DC Orders ED Discharge Orders     None        02/28/2021, MD 01/03/21 [phone]

## 2021-01-03 NOTE — Discharge Instructions (Addendum)
You may take ibuprofen, available over-the-counter according to label instructions. Start taking the prednisone as prescribed. You may increase the Flexeril to two tablets every night for sleep. You do not need to take the doxycycline.

## 2021-01-03 NOTE — ED Triage Notes (Signed)
Pt is c/o neck pain on the left side x 4 days  Pt states she was seen by a PA and was told she had an ear infection and was given antibiotics and then was seen by an orthopeadic and was given muscle relaxers  Pt states the pain is not any better and tonight she is having pain in her left ear for the first time

## 2022-07-26 ENCOUNTER — Other Ambulatory Visit: Payer: Self-pay | Admitting: Internal Medicine

## 2022-07-26 DIAGNOSIS — R1032 Left lower quadrant pain: Secondary | ICD-10-CM

## 2022-10-12 ENCOUNTER — Ambulatory Visit
Admission: RE | Admit: 2022-10-12 | Discharge: 2022-10-12 | Disposition: A | Discharge status: Home / Self Care | Payer: Commercial Managed Care - HMO | Source: Ambulatory Visit | Attending: Internal Medicine | Admitting: Internal Medicine

## 2022-10-12 DIAGNOSIS — R1032 Left lower quadrant pain: Secondary | ICD-10-CM

## 2022-10-12 MED ORDER — IOPAMIDOL (ISOVUE-300) INJECTION 61%
100.0000 mL | Freq: Once | INTRAVENOUS | Status: AC | PRN
Start: 2022-10-12 — End: 2022-10-12
  Administered 2022-10-12: 100 mL via INTRAVENOUS

## 2022-10-13 ENCOUNTER — Other Ambulatory Visit: Payer: Self-pay

## 2024-05-07 ENCOUNTER — Inpatient Hospital Stay (HOSPITAL_COMMUNITY)
Admission: AD | Admit: 2024-05-07 | Discharge: 2024-05-07 | Disposition: A | Discharge status: Home / Self Care | Payer: MEDICAID | Attending: Obstetrics and Gynecology | Admitting: Obstetrics and Gynecology

## 2024-05-07 ENCOUNTER — Encounter (HOSPITAL_COMMUNITY): Payer: Self-pay | Admitting: Obstetrics and Gynecology

## 2024-05-07 ENCOUNTER — Inpatient Hospital Stay (HOSPITAL_COMMUNITY): Payer: MEDICAID

## 2024-05-07 DIAGNOSIS — Z3A12 12 weeks gestation of pregnancy: Secondary | ICD-10-CM

## 2024-05-07 DIAGNOSIS — Z3490 Encounter for supervision of normal pregnancy, unspecified, unspecified trimester: Secondary | ICD-10-CM

## 2024-05-07 DIAGNOSIS — O209 Hemorrhage in early pregnancy, unspecified: Secondary | ICD-10-CM

## 2024-05-07 LAB — CBC
HCT: 37.8 % (ref 36.0–46.0)
Hemoglobin: 12.9 g/dL (ref 12.0–15.0)
MCH: 27.2 pg (ref 26.0–34.0)
MCHC: 34.1 g/dL (ref 30.0–36.0)
MCV: 79.6 fL — ABNORMAL LOW (ref 80.0–100.0)
Platelets: 260 K/uL (ref 150–400)
RBC: 4.75 MIL/uL (ref 3.87–5.11)
RDW: 13.1 % (ref 11.5–15.5)
WBC: 10.2 K/uL (ref 4.0–10.5)
nRBC: 0 % (ref 0.0–0.2)

## 2024-05-07 LAB — URINALYSIS, ROUTINE W REFLEX MICROSCOPIC
Bilirubin Urine: NEGATIVE
Glucose, UA: NEGATIVE mg/dL
Ketones, ur: NEGATIVE mg/dL
Leukocytes,Ua: NEGATIVE
Nitrite: NEGATIVE
Protein, ur: NEGATIVE mg/dL
Specific Gravity, Urine: 1.017 (ref 1.005–1.030)
pH: 5 (ref 5.0–8.0)

## 2024-05-07 LAB — ABO/RH: ABO/RH(D): O POS

## 2024-05-07 LAB — POCT PREGNANCY, URINE: Preg Test, Ur: POSITIVE — AB

## 2024-05-07 LAB — WET PREP, GENITAL
Clue Cells Wet Prep HPF POC: NONE SEEN
Sperm: NONE SEEN
Trich, Wet Prep: NONE SEEN
WBC, Wet Prep HPF POC: >=10 — AB (ref <10)
Yeast Wet Prep HPF POC: NONE SEEN

## 2024-05-07 LAB — GC/CHLAMYDIA PROBE AMP (~~LOC~~) NOT AT ARMC
Chlamydia: NEGATIVE
Comment: NEGATIVE
Comment: NORMAL
Neisseria Gonorrhea: NEGATIVE

## 2024-05-07 LAB — HCG, QUANTITATIVE, PREGNANCY: hCG, Beta Chain, Quant, S: 81809 m[IU]/mL — ABNORMAL HIGH (ref <5)

## 2024-05-07 NOTE — Discharge Instructions (Signed)
 Megan Morton,  You have a pregnancy that is dated by ultrasound to 12 weeks 4 days. This makes your due date 11/15/24.  Thank you for trusting us  to care for you, Megan Morton, Midwife  [facility] Providers   Center for [employer] [facility]             [address], [place], [place] [address] [phone]  Center for [employer] at [facility]                                                             [address], [place], [place], [address] [phone]  Center for [facility] at [facility] [address], [address], [place], [place], [address] [phone]  Center for [facility] at [facility] [address], [place], [place], [address] [phone]  Center for [facility] at [facility]                                 [address] [place], [place], [place], [address] [phone]  Center for [facility] at [facility]                                    [address], [place], [place], [address] [phone]  Center for [facility] at [facility] [address], [address], [place], [place], [address]                              [facility] [place] [address], [address], [place], [place], [address] [phone]  [facility]         Phone: [phone]  [facility]      Phone: [phone]   [facility]      Phone: [phone]  [facility]         Phone: [phone]   [facility]    Phone: [phone]  [facility]      Phone: [phone]  [facility]     Phone: [phone]  [facility]        Phone: [phone]  [facility] [place]) [phone]  [facility]      Phone:  [phone]  Safe Medications in Pregnancy   Acne:  Benzoyl Peroxide  Salicylic Acid   Backache/Headache:  Tylenol : 2 regular strength every 4 hours OR               2 Extra strength every 6 hours   Colds/Coughs/Allergies:  Benadryl  (alcohol free) 25 mg every 6 hours as needed  Breath right strips  Claritin  Cepacol throat lozenges  Chloraseptic throat spray  Cold-Eeze- up to three times per day  Cough drops, alcohol free  Flonase  (by prescription only)  Guaifenesin  Mucinex  Robitussin DM (  plain only, alcohol free)  Saline nasal spray/drops  Sudafed (pseudoephedrine) & Actifed * use only after [redacted] weeks gestation and if you do not have high blood pressure  Tylenol   Vicks Vaporub  Zinc lozenges  Zyrtec   Constipation:  Colace  Ducolax suppositories  Fleet enema  Glycerin suppositories  Metamucil  Milk of magnesia  Miralax  Senokot  Smooth move tea   Diarrhea:  Kaopectate  Imodium A-D   *NO pepto Bismol   Hemorrhoids:  Anusol  Anusol HC  Preparation H  Tucks   Indigestion:  Tums  Maalox  Mylanta  Zantac  Pepcid   Insomnia:  Benadryl  (alcohol free) 25mg  every 6 hours as needed  Tylenol  PM  Unisom, no Gelcaps   Leg Cramps:  Tums  MagGel   Nausea/Vomiting:  Bonine  Dramamine  Emetrol  Ginger extract  Sea bands  Meclizine  Nausea medication to take during pregnancy:  Unisom (doxylamine succinate 25 mg tablets) Take one tablet daily at bedtime. If symptoms are not adequately controlled, the dose can be increased to a maximum recommended dose of two tablets daily (1/2 tablet in the morning, 1/2 tablet mid-afternoon and one at bedtime).  Vitamin B6 100mg  tablets. Take one tablet twice a day (up to 200 mg per day).   Skin Rashes:  Aveeno products  Benadryl  cream or 25mg  every 6 hours as needed  Calamine Lotion  1% cortisone cream   Yeast infection:  Gyne-lotrimin 7  Monistat 7    **If taking multiple medications, please check labels  to avoid duplicating the same active ingredients  **take medication as directed on the label  ** Do not exceed 4000 mg of tylenol  in 24 hours  **Do not take medications that contain aspirin or ibuprofen

## 2024-05-07 NOTE — MAU Note (Addendum)
 MAU Triage Note: Megan Morton is a 43 y.o. at Unknown here in MAU reporting: she is [redacted] weeks pregnant and she woke up to notice scant amount of blood in her underwear. She is also report lower abdominal cramping. Denies abnormal discharge or recent IC.   Patient complaint: bleeding  Pain Score: 2  Pain Location: Abdomen     Onset of complaint: today LMP: Patient's last menstrual period was 02/14/2024.  Vitals:   05/07/24 0537  BP: 136/88  Pulse: 85  Resp: 18  Temp: 98.2 F (36.8 C)  SpO2: 100%    FHT:   N/A Lab orders placed from triage: UPT   Urine Pregnancy Test POSITIVE in MAU.

## 2024-05-07 NOTE — MAU Provider Note (Signed)
 None     S [name] is a 43 y.o. G1P0 pregnant female at 29 weeks  who presents to MAU today with complaint of vaginal bleeding in pregnancy. She reports the bleeding is small and she noted it in her underwear on waking this morning. She endorses minor cramping as well. Denies recent IC, no other discharge.   Has not yet initiated prenatal care. Typically goes to [facility].   Pertinent items noted in HPI and remainder of comprehensive ROS otherwise negative.   O BP 136/88 (BP Location: Right Arm)   Pulse 85   Temp 98.2 F (36.8 C) (Oral)   Resp 18   Ht 5' 2 (1.575 m)   Wt 105.1 kg   LMP 02/14/2024   SpO2 100%   BMI 42.38 kg/m  Physical Exam Vitals reviewed.  Constitutional:      Appearance: Normal appearance. She is well-developed.  HENT:     Head: Normocephalic.  Cardiovascular:     Rate and Rhythm: Normal rate and regular rhythm.     Pulses: Normal pulses.     Heart sounds: Normal heart sounds.  Pulmonary:     Effort: Pulmonary effort is normal.     Breath sounds: Normal breath sounds.  Skin:    General: Skin is warm and dry.     Capillary Refill: Capillary refill takes less than 2 seconds.  Neurological:     General: No focal deficit present.     Mental Status: She is alert and oriented to person, place, and time.      MDM:  Moderate  MAU Course:  A Intrauterine pregnancy - Plan: Discharge patient  [redacted] weeks gestation of pregnancy  Medical screening exam complete  CUBA workup with IUP noted. FHR 158 on US .   P Discharge from MAU in stable condition with strict return precautions Follow up at [facility] or preferred OB Gyn as scheduled for ongoing prenatal care  Allergies as of 05/07/2024       Reactions   Ampicillin         Medication List     STOP taking these medications    doxycycline 100 MG EC tablet Commonly known as: DORYX   RABEprazole 20 MG tablet Commonly known as: ACIPHEX       TAKE these medications    cyclobenzaprine 5  MG tablet Commonly known as: FLEXERIL Take 5 mg by mouth at bedtime as needed for muscle spasms.   fluticasone  50 MCG/ACT nasal spray Commonly known as: FLONASE  Place 2 sprays into both nostrils daily.   predniSONE 10 MG (21) Tbpk tablet Commonly known as: STERAPRED UNI-PAK 21 TAB Take 10 mg by mouth taper from 4 doses each day to 1 dose and stop.        Camie Rote, MSN, CNM 05/07/2024 3:24 PM  Certified Nurse Midwife, [facility]
